# Patient Record
Sex: Female | Born: 1970
Health system: Southern US, Community
[De-identification: ages and names within clinical notes are randomized; demographics above are authoritative.]

## PROBLEM LIST (undated history)

## (undated) DIAGNOSIS — I10 Essential (primary) hypertension: Secondary | ICD-10-CM

## (undated) DIAGNOSIS — R87619 Unspecified abnormal cytological findings in specimens from cervix uteri: Secondary | ICD-10-CM

## (undated) DIAGNOSIS — N924 Excessive bleeding in the premenopausal period: Secondary | ICD-10-CM

## (undated) DIAGNOSIS — E0590 Thyrotoxicosis, unspecified without thyrotoxic crisis or storm: Secondary | ICD-10-CM

## (undated) HISTORY — PX: CERVICAL BIOPSY  W/ LOOP ELECTRODE EXCISION: SUR135

## (undated) HISTORY — PX: APPENDECTOMY: SHX54

## (undated) HISTORY — PX: TUBAL LIGATION: SHX77

## (undated) HISTORY — DX: Excessive bleeding in the premenopausal period: N92.4

## (undated) HISTORY — PX: DG CHOLECYSTOGRAPHY GALL BLADDER (ARMC HX): HXRAD1480

## (undated) HISTORY — DX: Unspecified abnormal cytological findings in specimens from cervix uteri: R87.619

## (undated) HISTORY — DX: Essential (primary) hypertension: I10

## (undated) HISTORY — DX: Thyrotoxicosis, unspecified without thyrotoxic crisis or storm: E05.90

---

## 2015-07-20 DIAGNOSIS — E0500 Thyrotoxicosis with diffuse goiter without thyrotoxic crisis or storm: Secondary | ICD-10-CM | POA: Insufficient documentation

## 2015-07-20 DIAGNOSIS — E0590 Thyrotoxicosis, unspecified without thyrotoxic crisis or storm: Secondary | ICD-10-CM | POA: Insufficient documentation

## 2015-10-23 DIAGNOSIS — G4733 Obstructive sleep apnea (adult) (pediatric): Secondary | ICD-10-CM | POA: Insufficient documentation

## 2015-10-23 DIAGNOSIS — E669 Obesity, unspecified: Secondary | ICD-10-CM | POA: Insufficient documentation

## 2015-10-23 DIAGNOSIS — I10 Essential (primary) hypertension: Secondary | ICD-10-CM | POA: Insufficient documentation

## 2015-10-23 DIAGNOSIS — Z87442 Personal history of urinary calculi: Secondary | ICD-10-CM | POA: Insufficient documentation

## 2015-10-23 DIAGNOSIS — R7303 Prediabetes: Secondary | ICD-10-CM | POA: Insufficient documentation

## 2015-11-03 ENCOUNTER — Ambulatory Visit (INDEPENDENT_AMBULATORY_CARE_PROVIDER_SITE_OTHER): Payer: 59 | Admitting: Sports Medicine

## 2015-11-03 VITALS — BP 118/79 | HR 50 | Resp 16 | Ht 62.5 in | Wt 189.5 lb

## 2015-11-03 DIAGNOSIS — N921 Excessive and frequent menstruation with irregular cycle: Secondary | ICD-10-CM

## 2015-11-03 DIAGNOSIS — E0590 Thyrotoxicosis, unspecified without thyrotoxic crisis or storm: Secondary | ICD-10-CM

## 2015-11-03 DIAGNOSIS — I10 Essential (primary) hypertension: Secondary | ICD-10-CM

## 2015-11-03 DIAGNOSIS — IMO0002 Reserved for concepts with insufficient information to code with codable children: Secondary | ICD-10-CM

## 2015-11-03 DIAGNOSIS — E668 Other obesity: Secondary | ICD-10-CM

## 2015-11-03 MED ORDER — NORGESTIMATE-ETH ESTRADIOL 0.25-35 MG-MCG PO TABS: 1.0000 | ORAL_TABLET | Freq: Every day | ORAL | Status: DC

## 2015-11-03 MED ORDER — LIRAGLUTIDE -WEIGHT MANAGEMENT 18 MG/3ML ~~LOC~~ SOPN: 3.0000 mg | PEN_INJECTOR | Freq: Every day | SUBCUTANEOUS | Status: DC

## 2015-11-03 NOTE — Assessment & Plan Note (Signed)
Irregular bleeding that is excessive and between periods, no postcoital bleeding and minimal pain. Starting hormonal contraception. Also getting a pelvic and transvaginal ultrasound.  Some of this may be due to her thyroid dysfunction.

## 2015-11-03 NOTE — Assessment & Plan Note (Signed)
Recent TFTs were normal. Next line she is off of all medication including methimazole. She will keep a close eye for return of hyperthyroid symptoms, and if they occur we will get TFTs sooner.

## 2015-11-03 NOTE — Progress Notes (Signed)
  Subjective:    CC: Establish care.   HPI:  Hyperthyroidism: Initially diagnosed as Graves' disease, has been on methimazole for some time however this put her into hypothyroidism, she was then on Synthroid for a while, she has been off of methimazole and Synthroid for some time now, and most recent TSH, T3, T4 with her endocrinologist were normal.  Menometrorrhagia: Excessive bleeding, in between cycles, no post intercourse bleeding, no vaginal discharge, no dysuria. Bleeding occurs every 2 weeks. Minimal pain. Has never had a pelvic and transvaginal ultrasound has never been on hormonal contraception. She does have bilateral tubal ligation.  Hypertension: Controlled.  Obesity: 50 pound weight loss on Saxenda several months ago, then insurance company decided to stop covering this, she has had great difficulty getting this appealed.  Past medical history, Surgical history, Family history not pertinant except as noted below, Social history, Allergies, and medications have been entered into the medical record, reviewed, and no changes needed.   Review of Systems: No headache, visual changes, nausea, vomiting, diarrhea, constipation, dizziness, abdominal pain, skin rash, fevers, chills, night sweats, swollen lymph nodes, weight loss, chest pain, body aches, joint swelling, muscle aches, shortness of breath, mood changes, visual or auditory hallucinations.  Objective:    General: Well Developed, well nourished, and in no acute distress.  Neuro: Alert and oriented x3, extra-ocular muscles intact, sensation grossly intact.  HEENT: Normocephalic, atraumatic, pupils equal round reactive to light, neck supple, no masses, no lymphadenopathy, thyroid nonpalpable.  Skin: Warm and dry, no rashes noted.  Cardiac: Regular rate and rhythm, no murmurs rubs or gallops.  Respiratory: Clear to auscultation bilaterally. Not using accessory muscles, speaking in full sentences.  Abdominal: Soft, nontender,  nondistended, positive bowel sounds, no masses, no organomegaly.  Musculoskeletal: Shoulder, elbow, wrist, hip, knee, ankle stable, and with full range of motion.  Impression and Recommendations:    The patient was counselled, risk factors were discussed, anticipatory guidance given.  I spent 45 minutes with this patient, greater than 50% was face-to-face time counseling regarding the above diagnoses

## 2015-11-03 NOTE — Assessment & Plan Note (Signed)
50 pound weight loss initially on Saxenda several months ago, needs an appeal letter written. I am going to resend the prescription.

## 2015-11-03 NOTE — Assessment & Plan Note (Signed)
Controlled, no changes. 

## 2015-11-04 ENCOUNTER — Other Ambulatory Visit: Payer: Self-pay | Admitting: Sports Medicine

## 2015-11-04 DIAGNOSIS — N921 Excessive and frequent menstruation with irregular cycle: Secondary | ICD-10-CM

## 2015-11-09 ENCOUNTER — Encounter: Payer: Self-pay | Admitting: Sports Medicine

## 2015-11-10 MED ORDER — LIRAGLUTIDE 18 MG/3ML ~~LOC~~ SOPN: PEN_INJECTOR | SUBCUTANEOUS | Status: DC

## 2015-11-13 ENCOUNTER — Other Ambulatory Visit: Payer: 59

## 2015-12-02 ENCOUNTER — Ambulatory Visit: Payer: 59 | Admitting: Sports Medicine

## 2015-12-16 ENCOUNTER — Ambulatory Visit (INDEPENDENT_AMBULATORY_CARE_PROVIDER_SITE_OTHER): Payer: 59 | Admitting: Sports Medicine

## 2015-12-16 ENCOUNTER — Encounter: Payer: Self-pay | Admitting: Sports Medicine

## 2015-12-16 DIAGNOSIS — E668 Other obesity: Secondary | ICD-10-CM

## 2015-12-16 DIAGNOSIS — E0590 Thyrotoxicosis, unspecified without thyrotoxic crisis or storm: Secondary | ICD-10-CM

## 2015-12-16 DIAGNOSIS — IMO0002 Reserved for concepts with insufficient information to code with codable children: Secondary | ICD-10-CM

## 2015-12-16 LAB — TSH: TSH: 0.03 mIU/L — ABNORMAL LOW

## 2015-12-16 LAB — T3, FREE: T3, Free: 3.4 pg/mL (ref 2.3–4.2)

## 2015-12-16 LAB — T4, FREE: Free T4: 1.5 ng/dL (ref 0.8–1.8)

## 2015-12-16 MED ORDER — PHENTERMINE HCL 37.5 MG PO TABS: ORAL_TABLET | ORAL | Status: DC

## 2015-12-16 MED ORDER — LIRAGLUTIDE 18 MG/3ML ~~LOC~~ SOPN: PEN_INJECTOR | SUBCUTANEOUS | Status: DC

## 2015-12-16 NOTE — Assessment & Plan Note (Signed)
Has been off methimazole now for some time, feels okay, rechecking T3, T4, TSH.

## 2015-12-16 NOTE — Assessment & Plan Note (Signed)
Refilling Victoza,adding phentermine, return monthly for weight checks and refills.

## 2015-12-16 NOTE — Progress Notes (Signed)
  Subjective:    CC: weight check  HPI: Obesity: Good initial response to Victoza, we were unable to get Saxenda approved, 5 pound weight loss. Would like to also start phentermine.  Hyperthyroidism: Has been off of methimazole for some time now, feels okay, has not yet had her TSH, T3, T4 rechecked  Past medical history, Surgical history, Family history not pertinant except as noted below, Social history, Allergies, and medications have been entered into the medical record, reviewed, and no changes needed.   Review of Systems: No fevers, chills, night sweats, weight loss, chest pain, or shortness of breath.   Objective:    General: Well Developed, well nourished, and in no acute distress.  Neuro: Alert and oriented x3, extra-ocular muscles intact, sensation grossly intact.  HEENT: Normocephalic, atraumatic, pupils equal round reactive to light, neck supple, no masses, no lymphadenopathy, thyroid nonpalpable.  Skin: Warm and dry, no rashes. Cardiac: Regular rate and rhythm, no murmurs rubs or gallops, no lower extremity edema.  Respiratory: Clear to auscultation bilaterally. Not using accessory muscles, speaking in full sentences.  Impression and Recommendations:    I spent 25 minutes with this patient, greater than 50% was face-to-face time counseling regarding the above diagnoses

## 2016-01-13 ENCOUNTER — Ambulatory Visit: Payer: 59 | Admitting: Sports Medicine

## 2016-01-18 ENCOUNTER — Encounter: Payer: Self-pay | Admitting: Sports Medicine

## 2016-01-18 ENCOUNTER — Ambulatory Visit (INDEPENDENT_AMBULATORY_CARE_PROVIDER_SITE_OTHER): Payer: 59

## 2016-01-18 ENCOUNTER — Ambulatory Visit (INDEPENDENT_AMBULATORY_CARE_PROVIDER_SITE_OTHER): Payer: 59 | Admitting: Sports Medicine

## 2016-01-18 VITALS — BP 117/80 | HR 73 | Resp 16 | Wt 187.6 lb

## 2016-01-18 DIAGNOSIS — M5412 Radiculopathy, cervical region: Secondary | ICD-10-CM | POA: Diagnosis not present

## 2016-01-18 DIAGNOSIS — M542 Cervicalgia: Secondary | ICD-10-CM | POA: Diagnosis not present

## 2016-01-18 DIAGNOSIS — E668 Other obesity: Secondary | ICD-10-CM | POA: Diagnosis not present

## 2016-01-18 DIAGNOSIS — IMO0002 Reserved for concepts with insufficient information to code with codable children: Secondary | ICD-10-CM

## 2016-01-18 MED ORDER — PREDNISONE 50 MG PO TABS: ORAL_TABLET | ORAL | Status: DC

## 2016-01-18 MED ORDER — MELOXICAM 15 MG PO TABS: ORAL_TABLET | ORAL | Status: DC

## 2016-01-18 MED ORDER — CYCLOBENZAPRINE HCL 10 MG PO TABS: ORAL_TABLET | ORAL | Status: DC

## 2016-01-18 NOTE — Assessment & Plan Note (Signed)
Physical therapy, prednisone, Flexeril, x-rays.  Return in one month, MRI if no better.

## 2016-01-18 NOTE — Progress Notes (Signed)
  Subjective:    CC: Upper back pain  HPI: This is a pleasant 45 year old female, for the past several weeks she's had pain that she localizes in her left upper back, periscapular, moderate, persistent without radiation. Occasional neck tightness and occasional radiation down the arm.  Past medical history, Surgical history, Family history not pertinant except as noted below, Social history, Allergies, and medications have been entered into the medical record, reviewed, and no changes needed.   Review of Systems: No fevers, chills, night sweats, weight loss, chest pain, or shortness of breath.   Objective:    General: Well Developed, well nourished, and in no acute distress.  Neuro: Alert and oriented x3, extra-ocular muscles intact, sensation grossly intact.  HEENT: Normocephalic, atraumatic, pupils equal round reactive to light, neck supple, no masses, no lymphadenopathy, thyroid nonpalpable.  Skin: Warm and dry, no rashes. Cardiac: Regular rate and rhythm, no murmurs rubs or gallops, no lower extremity edema.  Respiratory: Clear to auscultation bilaterally. Not using accessory muscles, speaking in full sentences. Neck: Negative spurling's Full neck range of motion Grip strength and sensation normal in bilateral hands Strength good C4 to T1 distribution No sensory change to C4 to T1 Reflexes normal  Impression and Recommendations:   I spent 25 minutes with this patient, greater than 50% was face-to-face time counseling regarding the above diagnoses

## 2016-01-18 NOTE — Assessment & Plan Note (Signed)
Continue Victoza, has not yet started phentermine.

## 2016-01-25 ENCOUNTER — Encounter: Payer: Self-pay | Admitting: Rehabilitative and Restorative Service Providers"

## 2016-01-25 ENCOUNTER — Ambulatory Visit (INDEPENDENT_AMBULATORY_CARE_PROVIDER_SITE_OTHER): Payer: 59 | Admitting: Rehabilitative and Restorative Service Providers"

## 2016-01-25 DIAGNOSIS — M5412 Radiculopathy, cervical region: Secondary | ICD-10-CM | POA: Diagnosis not present

## 2016-01-25 DIAGNOSIS — M542 Cervicalgia: Secondary | ICD-10-CM

## 2016-01-25 DIAGNOSIS — R293 Abnormal posture: Secondary | ICD-10-CM | POA: Diagnosis not present

## 2016-01-25 NOTE — Therapy (Addendum)
[facility] [place] [address] [place], [place], [address] Phone: [phone]   Fax:  [phone]  Physical Therapy Evaluation  Patient Details  Name: Elizabeth Mcgee MRN: [id number] Date of Birth: [birth year] Referring Provider: Dr. Benjamin Stain  Encounter Date: 01/25/2016      PT End of Session - 01/25/16 1410    Visit Number 1   Number of Visits 4   Date for PT Re-Evaluation 02/20/16   PT Start Time 1416   PT Stop Time 1515   PT Time Calculation (min) 59 min   Activity Tolerance Patient tolerated treatment well      History reviewed. No pertinent past medical history.  History reviewed. No pertinent past surgical history.  There were no vitals filed for this visit.       Subjective Assessment - 01/25/16 1420    Subjective Patient reports tht she had neck and Lt shoulder pain about 2 weeks ago - which was a pain between her shoulder blades. The pain was worse when she was lying down. She was seen by Dr. Benjamin Stain and started on prednisone with excellent results. She is symptoms free at this time.    Pertinent History Denies any previous episodes with radicular pain. Has some stiffness in her neck at times and it sometimes "cracks" when she moves neck; LBP and sciatica several years ago - no problems in 2 years. now in allegria shoes and lost ~50 pounds.    Diagnostic tests xrays WNL's    Currently in Pain? No/denies            [facility] PT Assessment - 01/25/16 0001    Assessment   Medical Diagnosis Lt cervical radiculopathy    Referring Provider Dr. Benjamin Stain   Onset Date/Surgical Date 01/11/16   Hand Dominance Right   Next MD Visit 7/17   Prior Therapy none   Precautions   Precautions None   Balance Screen   Has the patient fallen in the past 6 months No   Has the patient had a decrease in activity level because of a fear of falling?  No   Is the patient reluctant to leave their home because of a fear of  falling?  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   3 children and husband    Observation/Other Assessments   Focus on Therapeutic Outcomes (FOTO)  28% limitation    Sensation   Additional Comments WFL's    Posture/Postural Control   Posture Comments head forwad shoudlers rounded and elevated head of the humerus anterior in orientation scapulae abducted and rotated along the thoracic wall    AROM   Overall AROM Comments end range tightness    Cervical Flexion 52   Cervical Extension 42   Cervical - Right Side Bend 32   Cervical - Left Side Bend 33   Cervical - Right Rotation 66   Cervical - Left Rotation 65   Strength   Overall Strength Comments 5/5 bilat UE's    Palpation   Palpation comment muscular tightness through the ant/lat/post cervical musculature; upper traps; leveator; pecs  [facility] PT Treatment/Exercise - 01/25/16 0001    Self-Care   Self-Care --  suggestions for body mechanics/positions    Therapeutic Activites    Therapeutic Activities --  myofacial ball release work    Neuro Re-ed    Neuro Re-ed Details  working on posture and alignment engaging posterior shoulder girdle musculature    Shoulder Exercises: Standing   Extension Strengthening;Both;20 reps;Theraband   Theraband Level (Shoulder Extension) Level 2 (Red)   Row Strengthening;Both;20 reps;Theraband   Theraband Level (Shoulder Row) Level 2 (Red)   Retraction Strengthening;Both;20 reps;Theraband   Theraband Level (Shoulder Retraction) Level 1 (Yellow)  with swim noodle for tactile cue/position    Other Standing  Exercises scap squeeze 10 sec x 10 with swim noodle    Shoulder Exercises: Stretch   Other Shoulder Stretches 3 way doorway stretch 30 sec x 2 each position    Other Shoulder Stretches supine snow angle prolonged stretch arms at ~90 deg (to progress by adding noodle along spine    Neck Exercises: Stretches   Other Neck Stretches axial extension 10 sec x 10 reps                 PT Education - 01/25/16 1452    Education provided Yes   Education Details HEP    Person(s) Educated Patient   Methods Explanation;Demonstration;Tactile cues;Verbal cues;Handout   Comprehension Verbalized understanding;Returned demonstration;Verbal cues required;Tactile cues required          PT Short Term Goals - 01/25/16 1531    PT SHORT TERM GOAL #1   Title Patient to be instructed in initial HEP and postural correction 01/25/16   Time 1   Period Days   Status Achieved           PT Long Term Goals - 01/25/16 1528    PT LONG TERM GOAL #1   Title Improve posture and alignment with patient to demonstrate improve upright posture with scapulae down and back 02/20/16   Time 4   Period Weeks   Status New   PT LONG TERM GOAL #2   Title Patient to demo and report good body mechanics for work and leisure activities 02/20/16   Time 4   Period Weeks   Status New   PT LONG TERM GOAL #3   Title Indiependent in HEP 02/20/16   Time 4   Period Weeks   Status New   PT LONG TERM GOAL #4   Title Improve FOTO to </= 18% limitation 02/20/16   Time 4   Period Weeks   Status New               Plan - 01/25/16 1521    Clinical Impression Statement Elizabeth Mcgee presents with history of Lt upper quarter radicular pain which has resolved with prendisone. She has poor posture and alignment; limited cervical mobility and end range shoulder flex/ext; muscular tenderness and tightness through ant/lat/posterior cervical musculature, upper traps, leveator, pecs, teres bilat. 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 Patient would benefit form PT to address postureal issues and begin a stretching and strengthening program but is unsure she wants to proceed with regular PT at this time since she is symptoms free. She will contact us to schedule if she decides to come to PT for additional appointments.    Rehab Potential Good   PT Frequency 1x / week  PT Duration 4 weeks   PT Treatment/Interventions Patient/family education;Neuromuscular re-education;ADLs/Self Care Home Management;Cryotherapy;Electrical Stimulation;Iontophoresis 30m/ml Dexamethasone;Moist Heat;Ultrasound;Manual techniques;Dry needling;Therapeutic activities;Therapeutic exercise   PT Next Visit Plan progress with strengthing anterior chest; strengthening posterior shoulder girdle musculature; possible trial of TDN v manual work through cervical and upper trap areas; modalities as indicated   Consulted and Agree with Plan of Care Patient      Patient will benefit from skilled therapeutic intervention in order to improve the following deficits and impairments:  Postural dysfunction, Improper body mechanics, Increased fascial restricitons, Increased muscle spasms, Decreased range of motion, Decreased mobility  Visit Diagnosis: Radiculopathy, cervical region - Plan: PT plan of care cert/re-cert  Cervicalgia - Plan: PT plan of care cert/re-cert  Abnormal posture - Plan: PT plan of care cert/re-cert     Problem List Patient Active Problem List   Diagnosis Date Noted  . Left cervical radiculopathy 01/18/2016  . Menometrorrhagia 11/03/2015  . Essential (primary) hypertension 10/23/2015  . H/O renal calculi 10/23/2015  . Obstructive apnea 10/23/2015  . Adult BMI 30+ 10/23/2015  . Hyperthyroidism 07/20/2015    Elizabeth Mcgee PNilda SimmerPT, MPH  01/25/2016, 3:34 PM  [facility] [place] [address]Phone: [phone]  Fax:  [phone] Name: Elizabeth SlabachMRN: [id number] of Birth: 8May 27, 1972 PHYSICAL THERAPY DISCHARGE SUMMARY  Visits from Start of Care: Eval only  Current functional level related to goals / functional outcomes: See above    Remaining deficits: unchanged   Education / Equipment: Initial HEP  Plan: Patient agrees to discharge.  Patient goals were not met. Patient is being discharged due to not returning since the last visit.  ?????    Elizabeth Mcgee P. HHelene KelpPT, MPH 06/01/16 7:45 AM

## 2016-01-25 NOTE — Patient Instructions (Signed)
Axial Extension (Chin Tuck)    Pull chin in and lengthen back of neck. Hold _10___ seconds while counting out loud. Repeat __10__ times. Do _several___ sessions per day.   Shoulder Blade Squeeze    Rotate shoulders back, then squeeze shoulder blades down and back. Hold 10 sec Repeat __10__ times. Do __several __ sessions per day. Can use swim noodle along spine to provide tactile cue for posture and tightening middle and lower traps   Self massage using about a 4 inch rubber ball - along the back between shoulder blades and around the shoulder blade as well as through the pecs in front   Scapula Adduction With Pectoralis Stretch: Low - Standing   Shoulders at 45 hands even with shoulders, keeping weight through legs, shift weight forward until you feel pull or stretch through the front of your chest. Hold _30__ seconds. Do _3__ times, _2-4__ times per day.   Scapula Adduction With Pectoralis Stretch: Mid-Range - Standing   Shoulders at 90 elbows even with shoulders, keeping weight through legs, shift weight forward until you feel pull or strength through the front of your chest. Hold __30_ seconds. Do _3__ times, __2-4_ times per day.   Scapula Adduction With Pectoralis Stretch: High - Standing   Shoulders at 120 hands up high on the doorway, keeping weight on feet, shift weight forward until you feel pull or stretch through the front of your chest. Hold _30__ seconds. Do _3__ times, _2-3__ times per day.   Resisted External Rotation: in Neutral - Bilateral   PALMS UP Sit or stand, tubing in both hands, elbows at sides, bent to 90, forearms forward. Pinch shoulder blades together and rotate forearms out. Keep elbows at sides. Repeat __10__ times per set. Do _2-3___ sets per session. Do _2-3___ sessions per day.   Low Row: Standing   Face anchor, feet shoulder width apart. Palms up, pull arms back, squeezing shoulder blades together. Repeat 10__ times per set. Do  2-3__ sets per session. Do 2-3__ sessions per week. Anchor Height: Waist   Strengthening: Resisted Extension   Hold tubing in right hand, arm forward. Pull arm back, elbow straight. Repeat _10___ times per set. Do 2-3____ sets per session. Do 2-3____ sessions per day.   TENS UNIT: This is helpful for muscle pain and spasm.   Search and Purchase a TENS 7000 2nd edition at www.tenspros.com. It should be less than $30.     TENS unit instructions: Do not shower or bathe with the unit on Turn the unit off before removing electrodes or batteries If the electrodes lose stickiness add a drop of water to the electrodes after they are disconnected from the unit and place on plastic sheet. If you continued to have difficulty, call the TENS unit company to purchase more electrodes. Do not apply lotion on the skin area prior to use. Make sure the skin is clean and dry as this will help prolong the life of the electrodes. After use, always check skin for unusual red areas, rash or other skin difficulties. If there are any skin problems, does not apply electrodes to the same area. Never remove the electrodes from the unit by pulling the wires. Do not use the TENS unit or electrodes other than as directed. Do not change electrode placement without consultating your therapist or physician. Keep 2 fingers with between each electrode.

## 2016-02-15 ENCOUNTER — Ambulatory Visit: Payer: 59 | Admitting: Sports Medicine

## 2016-03-02 ENCOUNTER — Encounter: Payer: Self-pay | Admitting: Sports Medicine

## 2016-03-02 ENCOUNTER — Ambulatory Visit (INDEPENDENT_AMBULATORY_CARE_PROVIDER_SITE_OTHER): Payer: 59 | Admitting: Sports Medicine

## 2016-03-02 DIAGNOSIS — G43009 Migraine without aura, not intractable, without status migrainosus: Secondary | ICD-10-CM | POA: Diagnosis not present

## 2016-03-02 DIAGNOSIS — M5412 Radiculopathy, cervical region: Secondary | ICD-10-CM

## 2016-03-02 DIAGNOSIS — E668 Other obesity: Secondary | ICD-10-CM

## 2016-03-02 DIAGNOSIS — IMO0002 Reserved for concepts with insufficient information to code with codable children: Secondary | ICD-10-CM

## 2016-03-02 MED ORDER — PHENTERMINE HCL 37.5 MG PO TABS: ORAL_TABLET | ORAL | 0 refills | Status: DC

## 2016-03-02 MED ORDER — KETOROLAC TROMETHAMINE 30 MG/ML IJ SOLN
30.0000 mg | Freq: Once | INTRAMUSCULAR | Status: AC
Start: 2016-03-02 — End: 2016-03-02
  Administered 2016-03-02: 30 mg via INTRAMUSCULAR

## 2016-03-02 NOTE — Assessment & Plan Note (Signed)
Refilling phentermine, 7 pound weight loss after the first month on phentermine, continue Victoza. If she plateaus we will add Topamax, Contrave will probably come on later as a maintenance medication.

## 2016-03-02 NOTE — Progress Notes (Signed)
  Subjective:    CC: Follow-up  HPI: Obesity: Good weight loss, finally started phentermine.  Cervical degenerative disc disease: Resolved with prednisone and therapy. Headache: History of migraine, moderate, persistent with photophobia, phonophobia, no nausea. Left-sided temporal and throbbing.  Past medical history, Surgical history, Family history not pertinant except as noted below, Social history, Allergies, and medications have been entered into the medical record, reviewed, and no changes needed.   Review of Systems: No fevers, chills, night sweats, weight loss, chest pain, or shortness of breath.   Objective:    General: Well Developed, well nourished, and in no acute distress.  Neuro: Alert and oriented x3, extra-ocular muscles intact, sensation grossly intact. Cranial nerves II through XII are intact, motor, sensory, coordinative functions are all intact.  HEENT: Normocephalic, atraumatic, pupils equal round reactive to light, neck supple, no masses, no lymphadenopathy, thyroid nonpalpable.  Skin: Warm and dry, no rashes. Cardiac: Regular rate and rhythm, no murmurs rubs or gallops, no lower extremity edema.  Respiratory: Clear to auscultation bilaterally. Not using accessory muscles, speaking in full sentences.  Toradol 30 mg given intramuscular.  Impression and Recommendations:    I spent 25 minutes with this patient, greater than 50% was face-to-face time counseling regarding the above diagnoses

## 2016-03-02 NOTE — Assessment & Plan Note (Signed)
Toradol 30 intramuscular. Return as needed. Topamax would be a good option for weight loss considering her migraines.

## 2016-03-02 NOTE — Assessment & Plan Note (Signed)
Resolved with prednisone and rehabilitation exercises

## 2016-03-04 ENCOUNTER — Other Ambulatory Visit: Payer: Self-pay | Admitting: Sports Medicine

## 2016-03-30 ENCOUNTER — Encounter: Payer: Self-pay | Admitting: Sports Medicine

## 2016-03-30 ENCOUNTER — Ambulatory Visit (INDEPENDENT_AMBULATORY_CARE_PROVIDER_SITE_OTHER): Payer: 59 | Admitting: Sports Medicine

## 2016-03-30 DIAGNOSIS — IMO0002 Reserved for concepts with insufficient information to code with codable children: Secondary | ICD-10-CM

## 2016-03-30 DIAGNOSIS — E668 Other obesity: Secondary | ICD-10-CM | POA: Diagnosis not present

## 2016-03-30 MED ORDER — PHENTERMINE HCL 37.5 MG PO TABS: ORAL_TABLET | ORAL | 0 refills | Status: DC

## 2016-03-30 MED ORDER — LIRAGLUTIDE 18 MG/3ML ~~LOC~~ SOPN: PEN_INJECTOR | SUBCUTANEOUS | 11 refills | Status: DC

## 2016-03-30 NOTE — Progress Notes (Signed)
  Subjective:    CC: Weight check  HPI: Obesity: Additional 6 pound weight loss after another month of phentermine, we are entering the third month.  Hypertension: Controlled on recheck.  Past medical history, Surgical history, Family history not pertinant except as noted below, Social history, Allergies, and medications have been entered into the medical record, reviewed, and no changes needed.   Review of Systems: No fevers, chills, night sweats, weight loss, chest pain, or shortness of breath.   Objective:    General: Well Developed, well nourished, and in no acute distress.  Neuro: Alert and oriented x3, extra-ocular muscles intact, sensation grossly intact.  HEENT: Normocephalic, atraumatic, pupils equal round reactive to light, neck supple, no masses, no lymphadenopathy, thyroid nonpalpable.  Skin: Warm and dry, no rashes. Cardiac: Regular rate and rhythm, no murmurs rubs or gallops, no lower extremity edema.  Respiratory: Clear to auscultation bilaterally. Not using accessory muscles, speaking in full sentences.  Impression and Recommendations:    Adult BMI 30+ Additional 6 lb weight loss, we are entering the third month.

## 2016-03-30 NOTE — Assessment & Plan Note (Signed)
Additional 6 lb weight loss, we are entering the third month.

## 2016-04-27 ENCOUNTER — Ambulatory Visit: Payer: 59 | Admitting: Sports Medicine

## 2016-07-11 ENCOUNTER — Ambulatory Visit (INDEPENDENT_AMBULATORY_CARE_PROVIDER_SITE_OTHER): Payer: 59 | Admitting: Sports Medicine

## 2016-07-11 ENCOUNTER — Encounter: Payer: Self-pay | Admitting: Sports Medicine

## 2016-07-11 VITALS — BP 138/85 | HR 66 | Resp 16 | Wt 169.5 lb

## 2016-07-11 DIAGNOSIS — E0590 Thyrotoxicosis, unspecified without thyrotoxic crisis or storm: Secondary | ICD-10-CM

## 2016-07-11 DIAGNOSIS — Z87898 Personal history of other specified conditions: Secondary | ICD-10-CM

## 2016-07-11 DIAGNOSIS — E669 Obesity, unspecified: Secondary | ICD-10-CM

## 2016-07-11 DIAGNOSIS — M542 Cervicalgia: Secondary | ICD-10-CM | POA: Diagnosis not present

## 2016-07-11 DIAGNOSIS — E66811 Obesity, class 1: Secondary | ICD-10-CM

## 2016-07-11 DIAGNOSIS — K649 Unspecified hemorrhoids: Secondary | ICD-10-CM | POA: Diagnosis not present

## 2016-07-11 DIAGNOSIS — Z0000 Encounter for general adult medical examination without abnormal findings: Secondary | ICD-10-CM

## 2016-07-11 MED ORDER — HYDROCORTISONE ACETATE 25 MG RE SUPP: 25.0000 mg | Freq: Two times a day (BID) | RECTAL | 2 refills | Status: DC | PRN

## 2016-07-11 MED ORDER — PHENTERMINE HCL 37.5 MG PO TABS: ORAL_TABLET | ORAL | 0 refills | Status: DC

## 2016-07-11 MED ORDER — DOCUSATE SODIUM 100 MG PO CAPS: 100.0000 mg | ORAL_CAPSULE | Freq: Three times a day (TID) | ORAL | 3 refills | Status: DC | PRN

## 2016-07-11 MED ORDER — PREDNISONE 50 MG PO TABS: ORAL_TABLET | ORAL | 0 refills | Status: DC

## 2016-07-11 MED ORDER — WITCH HAZEL-GLYCERIN EX PADS: 1.0000 "application " | MEDICATED_PAD | CUTANEOUS | 11 refills | Status: DC | PRN

## 2016-07-11 NOTE — Progress Notes (Signed)
  Subjective:    CC: refill medications  HPI:   Obesity: has lost  5.6 pounds over the last 3 months. Last took Phentermine in October. Discontinued due to insurance/cost. Would like to re-start today.  HTN: controlled on recheck  Hemorrhoid: has had rectal pain x 3 weeks and states  hemorrhoid has "prolapsed." OTC medications (witch hazel, Preparation H, and topical hydrocortisone) are not helping. States her BM's are not regular due to her hyperthyroidism, but denies constipation or straining.  Neck pain:  Has been taking Aleve for the past 2 weeks. No known injury. Feels tightness throughout the back of her neck and limited ROM. 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 She would like Prednisone.   Past medical history:  See flowsheet/record as well for more information.  Surgical history: See flowsheet/record as well for more information.  Family history: See flowsheet/record as well for more information.  Social history: See flowsheet/record as well for more information.  Allergies, and medications have been entered into the medical record, reviewed, and no changes needed.   Review of Systems: No fevers, chills, night sweats, chest pain, or shortness of breath.   Objective:    General: Alert, obese, no distress Neuro: extra-ocular muscles intact, normal gait HEENT: Normocephalic, atraumatic Skin: No rashes on exposed skin. Respiratory: Not using accessory muscles, speaking in full sentences.   Impression and Recommendations:     Hemorrhoids Will start stool softeners and symptomatic management with hydrocortisone suppositories and tucks.   History of prediabetes Monitoring. Rechecking A1C. Continuing weight loss regimen with Phentermine and lifestyle changes  Obesity (BMI 30.0-34.9) Restarting Phentermine. Recheck weight in 3 months.  Neck pain, acute Prednisone 50mg  x 5 days. Continue Naproxen 500mg  every 12 hours. Symptomatic management  Discussed  preventive care due including labs and Pap smear. Patient will schedule an appointment for her wellness exam. Labs ordered.

## 2016-07-11 NOTE — Assessment & Plan Note (Signed)
Monitoring. Rechecking A1C. Continuing weight loss regimen with Phentermine and lifestyle changes

## 2016-07-11 NOTE — Assessment & Plan Note (Signed)
Will start stool softeners and symptomatic management with hydrocortisone suppositories and tucks.

## 2016-07-11 NOTE — Assessment & Plan Note (Signed)
Prednisone 50mg  x 5 days. Continue Naproxen 500mg  every 12 hours. Symptomatic management

## 2016-07-11 NOTE — Patient Instructions (Addendum)
Make an appointment for your annual physical and Pap.  About Hemorrhoids  Hemorrhoids are swollen veins in the lower rectum and anus.  Also called piles, hemorrhoids are a common problem.  Hemorrhoids may be internal (inside the rectum) or external (around the anus).  Internal Hemorrhoids  Internal hemorrhoids are often painless, but they rarely cause bleeding.  The internal veins may stretch and fall down (prolapse) through the anus to the outside of the body.  The veins may then become irritated and painful.  External Hemorrhoids  External hemorrhoids can be easily seen or felt around the anal opening.  They are under the skin around the anus.  When the swollen veins are scratched or broken by straining, rubbing or wiping they sometimes bleed.  How Hemorrhoids Occur  Veins in the rectum and around the anus tend to swell under pressure.  Hemorrhoids can result from increased pressure in the veins of your anus or rectum.  Some sources of pressure are:   Straining to have a bowel movement because of constipation  Waiting too long to have a bowel movement  Coughing and sneezing often  Sitting for extended periods of time, including on the toilet  Diarrhea  Obesity  Trauma or injury to the anus  Some liver diseases  Stress  Family history of hemorrhoids  Pregnancy  Pregnant women should try to avoid becoming constipated, because they are more likely to have hemorrhoids during pregnancy.  In the last trimester of pregnancy, the enlarged uterus may press on blood vessels and causes hemorrhoids.  In addition, the strain of childbirth sometimes causes hemorrhoids after the birth.  Symptoms of Hemorrhoids  Some symptoms of hemorrhoids include:  Swelling and/or a tender lump around the anus  Itching, mild burning and bleeding around the anus  Painful bowel movements with or without constipation  Bright red blood covering the stool, on toilet paper or in the toilet bowel.    Symptoms usually go away within a few days.  Always talk to your doctor about any bleeding to make sure it is not from some other causes.  Diagnosing and Treating Hemorrhoids  Diagnosis is made by an examination by your healthcare provider.  Special test can be performed by your doctor.    Most cases of hemorrhoids can be treated with:  High-fiber diet: Eat more high-fiber foods, which help prevent constipation.  Ask for more detailed fiber information on types and sources of fiber from your healthcare provider.  Fluids: Drink plenty of water.  This helps soften bowel movements so they are easier to pass.  Sitz baths and cold packs: Sitting in lukewarm water two or three times a day for 15 minutes cleases the anal area and may relieve discomfort.  If the water is too hot, swelling around the anus will get worse.  Placing a cloth-covered ice pack on the anus for ten minutes four times a day can also help reduce selling.  Gently pushing a prolapsed hemorrhoid back inside after the bath or ice pack can be helpful.  Medications: For mild discomfort, your healthcare provider may suggest over-the-counter pain medication or prescribe a cream or ointment for topical use.  The cream may contain witch hazel, zinc oxide or petroleum jelly.  Medicated suppositories are also a treatment option.  Always consult your doctor before applying medications or creams.  Procedures and surgeries: There are also a number of procedures and surgeries to shrink or remove hemorrhoids in more serious cases.  Talk to your physician about  these options.  You can often prevent hemorrhoids or keep them from becoming worse by maintaining a healthy lifestyle.  Eat a fiber-rich diet of fruits, vegetables and whole grains.  Also, drink plenty of water and exercise regularly.   2007, [employer] Doc.30

## 2016-07-11 NOTE — Assessment & Plan Note (Signed)
Restarting Phentermine. Recheck weight in 3 months.

## 2016-07-13 LAB — CBC WITH DIFFERENTIAL/PLATELET
BASOS PCT: 1 %
Basophils Absolute: 67 cells/uL (ref 0–200)
EOS PCT: 1 %
Eosinophils Absolute: 67 cells/uL (ref 15–500)
HCT: 40.7 % (ref 35.0–45.0)
Hemoglobin: 13.9 g/dL (ref 11.7–15.5)
LYMPHS PCT: 23 %
Lymphs Abs: 1541 cells/uL (ref 850–3900)
MCH: 30.2 pg (ref 27.0–33.0)
MCHC: 34.2 g/dL (ref 32.0–36.0)
MCV: 88.5 fL (ref 80.0–100.0)
MONO ABS: 335 {cells}/uL (ref 200–950)
MONOS PCT: 5 %
MPV: 10.4 fL (ref 7.5–12.5)
NEUTROS PCT: 70 %
Neutro Abs: 4690 cells/uL (ref 1500–7800)
PLATELETS: 371 10*3/uL (ref 140–400)
RBC: 4.6 MIL/uL (ref 3.80–5.10)
RDW: 13.5 % (ref 11.0–15.0)
WBC: 6.7 10*3/uL (ref 3.8–10.8)

## 2016-07-13 LAB — LIPID PANEL
Cholesterol: 135 mg/dL (ref <200)
HDL: 50 mg/dL — ABNORMAL LOW (ref >50)
LDL CALC: 79 mg/dL (ref <100)
TRIGLYCERIDES: 31 mg/dL (ref <150)
Total CHOL/HDL Ratio: 2.7 Ratio (ref <5.0)
VLDL: 6 mg/dL (ref <30)

## 2016-07-13 LAB — TSH: TSH: 0.39 mIU/L — ABNORMAL LOW

## 2016-07-13 LAB — BASIC METABOLIC PANEL
BUN: 12 mg/dL (ref 7–25)
CALCIUM: 9.2 mg/dL (ref 8.6–10.2)
CO2: 25 mmol/L (ref 20–31)
CREATININE: 0.71 mg/dL (ref 0.50–1.10)
Chloride: 103 mmol/L (ref 98–110)
GLUCOSE: 101 mg/dL — AB (ref 65–99)
POTASSIUM: 3.9 mmol/L (ref 3.5–5.3)
Sodium: 139 mmol/L (ref 135–146)

## 2016-07-14 LAB — HEMOGLOBIN A1C
Hgb A1c MFr Bld: 5.2 % (ref <5.7)
Mean Plasma Glucose: 103 mg/dL

## 2016-07-15 NOTE — Progress Notes (Signed)
She does not need to take the Levothyroxine at this time. We are going to monitor and recheck her TSH in 6 months.

## 2016-07-25 ENCOUNTER — Ambulatory Visit (INDEPENDENT_AMBULATORY_CARE_PROVIDER_SITE_OTHER): Payer: 59 | Admitting: Physician Assistant

## 2016-07-25 ENCOUNTER — Encounter: Payer: Self-pay | Admitting: Physician Assistant

## 2016-07-25 VITALS — BP 143/89 | HR 72 | Wt 169.0 lb

## 2016-07-25 DIAGNOSIS — N907 Vulvar cyst: Secondary | ICD-10-CM | POA: Diagnosis not present

## 2016-07-25 DIAGNOSIS — Z124 Encounter for screening for malignant neoplasm of cervix: Secondary | ICD-10-CM | POA: Diagnosis not present

## 2016-07-25 DIAGNOSIS — E0590 Thyrotoxicosis, unspecified without thyrotoxic crisis or storm: Secondary | ICD-10-CM | POA: Diagnosis not present

## 2016-07-25 NOTE — Progress Notes (Signed)
Elizabeth Mcgee is a 45 y.o. female who [facility]: [facility] today for Pap smear  Patient reports cysts on her vulva that she would like checked today. Cysts have been present for years.They are not painful and do not cause her any symptoms, but she is wondering if she should have them removed.  Patient has a history of menorrhagia, but states periods have been more mild and irregular recently. Periods typically last 4-5 days. LMP was first week in November.   She has a history of ASCUS (1993), but states her most recent Pap performed was normal.    Subclinical hyperthyroidism: patient has concerns about her thyroid that she wished to discuss today. She has previously been on Methimazole 10mg  TID, but this was [facility] March. She is worried that if she is not taking medication she will develop physical manifestations of Graves, including bulging eyes. She endorses some heat intolerance. Denies palpitations, weakness, unexplained weight gain. Lab Results  Component Value Date   TSH 0.39 (L) 07/11/2016  12/16/15 TSH: 0.03 Free T4: 1.5 Free T3: 3.4  10/23/15 TSH: 2.38 Free T4: 0.81 Free T3: 4.29 (while taking Methimazole)  Past Medical History:  Diagnosis Date  . Abnormal Pap smear of cervix   . Hypertension   . Hyperthyroidism   . Menorrhagia, premenopausal    Past Surgical History:  Procedure Laterality Date  . CERVICAL BIOPSY  W/ LOOP ELECTRODE EXCISION     Social History  Substance Use Topics  . Smoking status: Never Smoker  . Smokeless tobacco: Not on file  . Alcohol use Not on file   family history is not on file.  ROS: negative except as noted in the HPI Review of Systems  Constitutional: Positive for diaphoresis (occasional).  Cardiovascular: Negative for palpitations.  Gastrointestinal: Negative for abdominal pain.    Genitourinary: Negative for dysuria, frequency and urgency.       Denies dyspareunia, dysmenorrhea, and menorrhagia  Neurological: Negative for dizziness, tremors and focal weakness.    Medications: Current Outpatient Prescriptions  Medication Sig Dispense Refill  . docusate sodium (COLACE) 100 MG capsule Take 1 capsule (100 mg total) by mouth 3 (three) times daily as needed. 90 capsule 3  . hydrocortisone (ANUSOL-HC) 25 MG suppository Place 1 suppository (25 mg total) rectally 2 (two) times daily as needed for hemorrhoids or itching. 24 suppository 2  . Liraglutide (VICTOZA) 18 MG/3ML SOPN 1.8 mg injected subcutaneously daily.  Please include needles. 3 pen 11  . meloxicam (MOBIC) 15 MG tablet One tab PO qAM with breakfast for 2 weeks, then daily prn pain. 30 tablet 3  . methimazole (TAPAZOLE) 10 MG tablet Take 1 tablet by mouth daily.    . metoprolol tartrate (LOPRESSOR) 25 MG tablet Take 1 tablet by mouth 2 (two) times daily.    . phentermine (ADIPEX-P) 37.5 MG tablet One tab by mouth qAM 30 tablet 0  . predniSONE (DELTASONE) 50 MG tablet One tab PO daily for 5 days. 5 tablet 0  . witch hazel-glycerin (TUCKS) pad Apply 1 application topically as needed. 1 each 11   No current facility-administered medications for this visit.    No Known Allergies  Health Maintenance Health Maintenance  Topic Date Due  . HIV Screening  03/11/1986  . TETANUS/TDAP  03/11/1990  . INFLUENZA VACCINE  03/08/2016  . PAP SMEAR  08/08/2018     Objective:  BP (!) 143/89   Pulse 72   Wt  169 lb (76.7 kg)   BMI 30.42 kg/m  Physical Exam  Constitutional: She does not appear ill. No distress.  obese  HENT:  Head: Normocephalic and atraumatic.  Eyes: EOM and lids are normal.  No exopthalmos  Neck:  No goiter  Pulmonary/Chest: Effort normal.  Genitourinary: Vagina normal. Rectal exam shows external hemorrhoid.    Pelvic exam was performed with patient supine. There is no rash or tenderness on  the right labia. There is no rash or tenderness on the left labia. Cervix exhibits discharge (milky, whit). Cervix exhibits no friability.  Genitourinary Comments: Pap harvested from cervical os. Bimanual exam deferred.  Neurological: She is alert.  Skin: Skin is warm and dry. No rash noted.  Psychiatric: She has a normal mood and affect. Her speech is normal. Thought content normal.    I personally reviewed [facility] records in Care Everywhere, including Pap results and thyroid lab results.   Assessment and Plan: 45 y.o. female with   1. Encounter for Papanicolaou smear of cervix - At the end of the visit patient remembered that she had a Pap in January of this year. She requested to cancel the Pap  2. Epidermal cyst of vulva - Ambulatory referral to Gynecology  3. Subclinical hyperthyroidism - reviewed her previous thyroid panels together and explained what subclinical hyperthyroidism means. Provided reassurance.  - Plan to check thyroid labs every 3 months. Standing orders placed - TSH; Standing - T3, free; Standing - T4, free; Standing  Patient education and anticipatory guidance given Patient agrees with treatment plan Follow-up as needed  Levonne Hubertharley E. Halden Phegley PA-C

## 2016-07-25 NOTE — Patient Instructions (Signed)
Epidermal Cyst An epidermal cyst is sometimes called a sebaceous cyst, epidermal inclusion cyst, or infundibular cyst. These cysts usually contain a substance that looks "pasty" or "cheesy" and may have a bad smell. This substance is a protein called keratin. Epidermal cysts are usually found on the face, neck, or trunk. They may also occur in the vaginal area or other parts of the genitalia of both men and women. Epidermal cysts are usually small, painless, slow-growing bumps or lumps that move freely under the skin. It is important not to try to pop them. This may cause an infection and lead to tenderness and swelling. CAUSES  Epidermal cysts may be caused by a deep penetrating injury to the skin or a plugged hair follicle, often associated with acne. SYMPTOMS  Epidermal cysts can become inflamed and cause:  Redness.  Tenderness.  Increased temperature of the skin over the bumps or lumps.  Grayish-white, bad smelling material that drains from the bump or lump. DIAGNOSIS  Epidermal cysts are easily diagnosed by your caregiver during an exam. Rarely, a tissue sample (biopsy) may be taken to rule out other conditions that may resemble epidermal cysts. TREATMENT   Epidermal cysts often get better and disappear on their own. They are rarely ever cancerous.  If a cyst becomes infected, it may become inflamed and tender. This may require opening and draining the cyst. Treatment with antibiotics may be necessary. When the infection is gone, the cyst may be removed with minor surgery.  Small, inflamed cysts can often be treated with antibiotics or by injecting steroid medicines.  Sometimes, epidermal cysts become large and bothersome. If this happens, surgical removal in your caregiver's office may be necessary. HOME CARE INSTRUCTIONS  Only take over-the-counter or prescription medicines as directed by your caregiver.  Take your antibiotics as directed. Finish them even if you start to feel  better. SEEK MEDICAL CARE IF:   Your cyst becomes tender, red, or swollen.  Your condition is not improving or is getting worse.  You have any other questions or concerns. MAKE SURE YOU:  Understand these instructions.  Will watch your condition.  Will get help right away if you are not doing well or get worse. This information is not intended to replace advice given to you by your health care provider. Make sure you discuss any questions you have with your health care provider. Document Released: 06/25/2004 Document Revised: 10/17/2011 Document Reviewed: 05/27/2015 Elsevier Interactive Patient Education  2017 Elsevier Inc.  Pap Test Why am I having this test? A pap test is sometimes called a pap smear. It is a screening test that is used to check for signs of cancer of the vagina, cervix, and uterus. The test can also identify the presence of infection or precancerous changes. Your health care provider will likely recommend you have this test done on a regular basis. This test may be done:  Every 3 years, starting at age 45.  Every 5 years, in combination with testing for the presence of human papillomavirus (HPV).  More or less often depending on other medical conditions. What kind of sample is taken? Using a small cotton swab, plastic spatula, or brush, your health care provider will collect a sample of cells from the surface of your cervix. Your cervix is the opening to your uterus, also called a womb. Secretions from the cervix and vagina may also be collected. How do I prepare for this test?  Be aware of where you are in your menstrual cycle.  You may be asked to reschedule the test if you are menstruating on the day of the test.  You may need to reschedule if you have a known vaginal infection on the day of the test.  You may be asked to avoid douching or taking a bath the day before or the day of the test.  Some medicines can cause abnormal test results, such as  digitalis and tetracycline. Talk with your health care provider before your test if you take one of these medicines. What do the results mean? Abnormal test results may indicate a number of health conditions. These may include:  Cancer. Although pap test results cannot be used to diagnose cancer of the cervix, vagina, or uterus, they may suggest the possibility of cancer. Further tests would be required to determine if cancer is present.  Sexually transmitted disease.  Fungal infection.  Parasite infection.  Herpes infection.  A condition causing or contributing to infertility. It is your responsibility to obtain your test results. Ask the lab or department performing the test when and how you will get your results. Contact your health care provider to discuss any questions you have about your results. Talk with your health care provider to discuss your results, treatment options, and if necessary, the need for more tests. Talk with your health care provider if you have any questions about your results. This information is not intended to replace advice given to you by your health care provider. Make sure you discuss any questions you have with your health care provider. Document Released: 10/15/2002 Document Revised: 03/30/2016 Document Reviewed: 12/16/2013 Elsevier Interactive Patient Education  2017 [employer]

## 2016-10-17 ENCOUNTER — Telehealth: Payer: Self-pay | Admitting: Sports Medicine

## 2016-10-17 NOTE — Telephone Encounter (Signed)
Patient called upset about Jul 11, 2016 and Jul 25, 2016 [facility] and advising that her insurance stated [facility] is not in network and Dr. Karie Schwalbe is a specialist and they are not going to pay. I advised pt that she needs to call her insurance company and give Dr. Melvia Heaps's information and let them know that [facility] is listed under Dr. Karie Schwalbe emailed Haskell RilingWendy Blum about pt's complaint. Pt adv she was directed to call our office. Thanks

## 2016-10-18 ENCOUNTER — Ambulatory Visit (INDEPENDENT_AMBULATORY_CARE_PROVIDER_SITE_OTHER): Payer: 59 | Admitting: Osteopathic Medicine

## 2016-10-18 ENCOUNTER — Other Ambulatory Visit (HOSPITAL_COMMUNITY)
Admission: RE | Admit: 2016-10-18 | Discharge: 2016-10-18 | Disposition: A | Discharge status: Home / Self Care | Payer: 59 | Source: Ambulatory Visit | Attending: Osteopathic Medicine | Admitting: Osteopathic Medicine

## 2016-10-18 ENCOUNTER — Encounter: Payer: Self-pay | Admitting: Osteopathic Medicine

## 2016-10-18 VITALS — BP 130/86 | HR 82 | Ht 62.0 in | Wt 162.0 lb

## 2016-10-18 DIAGNOSIS — Z01419 Encounter for gynecological examination (general) (routine) without abnormal findings: Secondary | ICD-10-CM | POA: Diagnosis not present

## 2016-10-18 DIAGNOSIS — Z113 Encounter for screening for infections with a predominantly sexual mode of transmission: Secondary | ICD-10-CM

## 2016-10-18 DIAGNOSIS — E0590 Thyrotoxicosis, unspecified without thyrotoxic crisis or storm: Secondary | ICD-10-CM | POA: Diagnosis not present

## 2016-10-18 DIAGNOSIS — Z124 Encounter for screening for malignant neoplasm of cervix: Secondary | ICD-10-CM

## 2016-10-18 DIAGNOSIS — R7989 Other specified abnormal findings of blood chemistry: Secondary | ICD-10-CM

## 2016-10-18 DIAGNOSIS — R946 Abnormal results of thyroid function studies: Secondary | ICD-10-CM | POA: Diagnosis not present

## 2016-10-18 DIAGNOSIS — N907 Vulvar cyst: Secondary | ICD-10-CM | POA: Diagnosis not present

## 2016-10-18 DIAGNOSIS — Z1151 Encounter for screening for human papillomavirus (HPV): Secondary | ICD-10-CM | POA: Diagnosis not present

## 2016-10-18 LAB — WET PREP, GENITAL
Clue Cells Wet Prep HPF POC: NONE SEEN
TRICH WET PREP: NONE SEEN
Yeast Wet Prep HPF POC: NONE SEEN

## 2016-10-18 NOTE — Progress Notes (Signed)
HPI: Elizabeth Mcgee is a 46 y.o. female  who [facility] [place] today, 10/18/16,  for chief complaint of:  Chief Complaint  Patient presents with  . Annual Exam     Here for well-woman exam and STI check.   Hx Abn Pap, not sure date, no records avail.   Recently seen for female exam, Pap cancelled since pt had one in the same year. Referral to GYN for vaginal cysts.   Has some questions about cholesterol.   Would like refill on obesity medications   Thyroid tests recently ordered but not completed. Pt states was previously seeing endocrinologist, subclinical hyperthyroid on problem list    Past medical, surgical, social and family history reviewed: Patient Active Problem List   Diagnosis Date Noted  . Epidermal cyst of vulva 07/25/2016  . Neck pain, acute 07/11/2016  . Obesity (BMI 30.0-34.9) 07/11/2016  . Hemorrhoids 07/11/2016  . History of prediabetes 07/11/2016  . Migraine headache without aura 03/02/2016  . Left cervical radiculopathy 01/18/2016  . Menometrorrhagia 11/03/2015  . Essential (primary) hypertension 10/23/2015  . H/O renal calculi 10/23/2015  . Obstructive apnea 10/23/2015  . Adult BMI 30+ 10/23/2015  . Subclinical hyperthyroidism 07/20/2015   Past Surgical History:  Procedure Laterality Date  . CERVICAL BIOPSY  W/ LOOP ELECTRODE EXCISION     Social History  Substance Use Topics  . Smoking status: Never Smoker  . Smokeless tobacco: Never Used  . Alcohol use Not on file   No family history on file.   Current medication list and allergy/intolerance information reviewed:   Current Outpatient Prescriptions  Medication Sig Dispense Refill  . docusate sodium (COLACE) 100 MG capsule Take 1 capsule (100 mg total) by mouth 3 (three) times daily as needed. 90 capsule 3  . hydrocortisone (ANUSOL-HC) 25 MG suppository Place 1 suppository (25 mg total) rectally 2 (two) times daily as needed for hemorrhoids or itching.  24 suppository 2  . Liraglutide (VICTOZA) 18 MG/3ML SOPN 1.8 mg injected subcutaneously daily.  Please include needles. 3 pen 11  . meloxicam (MOBIC) 15 MG tablet One tab PO qAM with breakfast for 2 weeks, then daily prn pain. 30 tablet 3  . methimazole (TAPAZOLE) 10 MG tablet Take 1 tablet by mouth daily.    . metoprolol tartrate (LOPRESSOR) 25 MG tablet Take 1 tablet by mouth 2 (two) times daily.    . phentermine (ADIPEX-P) 37.5 MG tablet One tab by mouth qAM 30 tablet 0  . witch hazel-glycerin (TUCKS) pad Apply 1 application topically as needed. 1 each 11   No current facility-administered medications for this visit.    No Known Allergies    Review of Systems:  Constitutional:  No  fever, no chills, No recent illness  Cardiac: No  chest pain, No  pressure, No palpitations, No  Orthopnea  Respiratory:  No  shortness of breath. No  Cough   Genitourinary: No  abnormal genital bleeding, +abnormal genital discharge  Skin: No  Rash, No other wounds/concerning lesions  Psychiatric: No  concerns with depression, No  concerns with anxiety, No sleep problems, No mood problems  Exam:  BP 130/86   Pulse 82   Ht 5\' 2"  (1.575 m)   Wt 162 lb (73.5 kg)   BMI 29.63 kg/m   Constitutional: VS see above. General Appearance: alert, well-developed, well-nourished, NAD  Eyes: Normal lids and conjunctive, non-icteric sclera  Ears, Nose, Mouth, Throat: MMM, Normal external inspection ears/nares/mouth/lips/gums  Neck: No masses, trachea  midline. No thyroid enlargement.   Respiratory: Normal respiratory effort.   Gastrointestinal: Nontender, no masses.  Musculoskeletal: Gait normal. No clubbing/cyanosis of digits.   Neurological: Normal balance/coordination. No tremor.  Skin: warm, dry, intact. No rash/ulcer.   Psychiatric: Normal judgment/insight. Normal mood and affect. Oriented x3.  GYN: No rash/ulcers to external genitalia, normal urethra, normal vaginal mucosa, physiologic  discharge, cervix normal without lesions, uterus not enlarged or tender, adnexa no masses and nontender  BREAST: No rashes/skin changes, normal fibrous breast tissue, no masses or tenderness, normal nipple without discharge, normal axilla    ASSESSMENT/PLAN:   Cervical cancer screening - Plan: Cytology - PAP  Routine screening for STI (sexually transmitted infection) - Plan: Cytology - PAP, Hepatitis B core antibody, total, Hepatitis B surface antibody, Hepatitis B surface antigen, HIV antibody, RPR, Hepatitis C antibody, Wet prep, genital  Abnormal thyroid blood test - labs today, followup with PCP - Plan: Thyroid Panel With TSH  Subclinical hyperthyroidism  Epidermal cyst of vulva - Hasn't heard about GYN referral, recently changed phone numbers, advised visit their office after appt here and schedule consultation     Advised follow-up with PCP for routine care and other medical concerns. Did not refill phentermine today.      Visit summary with medication list and pertinent instructions was printed for patient to review. All questions at time of visit were answered - patient instructed to contact office with any additional concerns. ER/RTC precautions were reviewed with the patient. Follow-up plan: Return in about 2 weeks (around 11/01/2016) for visit with Dr. Karie Schwalbe re: thyroid and other routine medical care .

## 2016-10-19 LAB — THYROID PANEL WITH TSH
FREE THYROXINE INDEX: 2.1 (ref 1.4–3.8)
T3 UPTAKE: 30 % (ref 22–35)
T4 TOTAL: 6.9 ug/dL (ref 4.5–12.0)
TSH: 0.83 mIU/L

## 2016-10-19 LAB — HEPATITIS B CORE ANTIBODY, TOTAL: Hep B Core Total Ab: NONREACTIVE

## 2016-10-19 LAB — HEPATITIS C ANTIBODY: HCV AB: NEGATIVE

## 2016-10-19 LAB — CYTOLOGY - PAP
Chlamydia: NEGATIVE
Diagnosis: NEGATIVE
HPV: NOT DETECTED
Neisseria Gonorrhea: NEGATIVE
Trichomonas: NEGATIVE

## 2016-10-19 LAB — RPR

## 2016-10-19 LAB — HEPATITIS B SURFACE ANTIBODY,QUALITATIVE: Hep B S Ab: POSITIVE — AB

## 2016-10-19 LAB — HEPATITIS B SURFACE ANTIGEN: HEP B S AG: NEGATIVE

## 2016-10-19 LAB — HIV ANTIBODY (ROUTINE TESTING W REFLEX): HIV 1&2 Ab, 4th Generation: NONREACTIVE

## 2016-11-02 ENCOUNTER — Ambulatory Visit: Payer: 59 | Admitting: Sports Medicine

## 2016-11-07 ENCOUNTER — Ambulatory Visit (INDEPENDENT_AMBULATORY_CARE_PROVIDER_SITE_OTHER): Payer: 59 | Admitting: Sports Medicine

## 2016-11-07 ENCOUNTER — Encounter: Payer: Self-pay | Admitting: Sports Medicine

## 2016-11-07 DIAGNOSIS — G518 Other disorders of facial nerve: Secondary | ICD-10-CM

## 2016-11-07 DIAGNOSIS — E669 Obesity, unspecified: Secondary | ICD-10-CM

## 2016-11-07 DIAGNOSIS — L908 Other atrophic disorders of skin: Secondary | ICD-10-CM | POA: Insufficient documentation

## 2016-11-07 DIAGNOSIS — E66811 Obesity, class 1: Secondary | ICD-10-CM

## 2016-11-07 MED ORDER — PHENTERMINE HCL 37.5 MG PO TABS: ORAL_TABLET | ORAL | 0 refills | Status: DC

## 2016-11-07 NOTE — Progress Notes (Signed)
  Subjective:    CC: Weight check  HPI: This is a pleasant 46 year old female, she is here for a weight check, her last phentermine prescription was 4 months ago, she's lost 3 pounds, and has been out of phentermine for several months. She would like to restart.  She also some concerns about excess skin on her neck.  Past medical history:  Negative.  See flowsheet/record as well for more information.  Surgical history: Negative.  See flowsheet/record as well for more information.  Family history: Negative.  See flowsheet/record as well for more information.  Social history: Negative.  See flowsheet/record as well for more information.  Allergies, and medications have been entered into the medical record, reviewed, and no changes needed.   Review of Systems: No fevers, chills, night sweats, weight loss, chest pain, or shortness of breath.   Objective:    General: Well Developed, well nourished, and in no acute distress.  Neuro: Alert and oriented x3, extra-ocular muscles intact, sensation grossly intact.  HEENT: Normocephalic, atraumatic, pupils equal round reactive to light, neck supple, no masses, no lymphadenopathy, thyroid nonpalpable. Visible [place] neck. Skin: Warm and dry, no rashes. Cardiac: Regular rate and rhythm, no murmurs rubs or gallops, no lower extremity edema.  Respiratory: Clear to auscultation bilaterally. Not using accessory muscles, speaking in full sentences.  Impression and Recommendations:    Obesity (BMI 30.0-34.9) 3 pound weight loss over the past 3 months. Refilling phentermine but she will do a half dose every day for 3 months, return to see me in 3 months. She does have the added stress of her recent separation from her husband. She is also interested in platysmaplasty, I have asked her to look into this and if she desires she can call me and I will place the referral.  I spent 25 minutes with this patient, greater than 50% was face-to-face time  counseling regarding the above diagnoses

## 2016-11-07 NOTE — Assessment & Plan Note (Signed)
3 pound weight loss over the past 3 months. Refilling phentermine but she will do a half dose every day for 3 months, return to see me in 3 months. She does have the added stress of her recent separation from her husband. She is also interested in platysmaplasty, I have asked her to look into this and if she desires she can call me and I will place the referral.

## 2016-11-07 NOTE — Patient Instructions (Signed)
Look into platysmaplasty

## 2016-11-07 NOTE — Assessment & Plan Note (Signed)
With [place] neck. Recommended referral to plastic surgery for consideration of platysmaplasty.

## 2017-04-14 ENCOUNTER — Other Ambulatory Visit: Payer: Self-pay

## 2017-04-14 MED ORDER — LIRAGLUTIDE 18 MG/3ML ~~LOC~~ SOPN: PEN_INJECTOR | SUBCUTANEOUS | 11 refills | Status: DC

## 2017-07-14 ENCOUNTER — Ambulatory Visit (INDEPENDENT_AMBULATORY_CARE_PROVIDER_SITE_OTHER): Payer: 59 | Admitting: Sports Medicine

## 2017-07-14 ENCOUNTER — Encounter: Payer: Self-pay | Admitting: Sports Medicine

## 2017-07-14 DIAGNOSIS — E6609 Other obesity due to excess calories: Secondary | ICD-10-CM | POA: Diagnosis not present

## 2017-07-14 MED ORDER — PHENTERMINE HCL 37.5 MG PO TABS
ORAL_TABLET | ORAL | 0 refills | Status: DC
Start: 2017-07-14 — End: 2017-08-16

## 2017-07-14 MED ORDER — MUPIROCIN CALCIUM 2 % NA OINT: 1.0000 "application " | TOPICAL_OINTMENT | Freq: Two times a day (BID) | NASAL | 3 refills | Status: DC

## 2017-07-14 MED ORDER — LIRAGLUTIDE 18 MG/3ML ~~LOC~~ SOPN: PEN_INJECTOR | SUBCUTANEOUS | 11 refills | Status: DC

## 2017-07-14 NOTE — Assessment & Plan Note (Addendum)
Adding phentermine and Victoza. Return monthly for weight checks and refills. If insufficient weight loss at the follow-up visit we will add Topamax

## 2017-07-14 NOTE — Progress Notes (Signed)
  Subjective:    CC: Obesity  HPI: Elizabeth Mcgee returns, she struggled with her weight all of her life, we did phentermine and Victoza sometime ago, her insurance switched and she now has difficulty getting her Victoza.  She also like a refill on phentermine.  She is also complaining of a sore inside her right nasal nare, desires that we add nasal mupirocin.  Past medical history:  Negative.  See flowsheet/record as well for more information.  Surgical history: Negative.  See flowsheet/record as well for more information.  Family history: Negative.  See flowsheet/record as well for more information.  Social history: Negative.  See flowsheet/record as well for more information.  Allergies, and medications have been entered into the medical record, reviewed, and no changes needed.   (To billers/coders, pertinent past medical, social, surgical, family history can be found in problem list, if problem list is marked as reviewed then this indicates that past medical, social, surgical, family history was also reviewed)  Review of Systems: No fevers, chills, night sweats, weight loss, chest pain, or shortness of breath.   Objective:    General: Well Developed, well nourished, and in no acute distress.  Neuro: Alert and oriented x3, extra-ocular muscles intact, sensation grossly intact.  HEENT: Normocephalic, atraumatic, pupils equal round reactive to light, neck supple, no masses, no lymphadenopathy, thyroid nonpalpable.  Skin: Warm and dry, no rashes. Cardiac: Regular rate and rhythm, no murmurs rubs or gallops, no lower extremity edema.  Respiratory: Clear to auscultation bilaterally. Not using accessory muscles, speaking in full sentences.  Impression and Recommendations:    Obesity Adding phentermine and Victoza. Return monthly for weight checks and refills. If insufficient weight loss at the follow-up visit we will add Topamax  I spent 40 minutes with this patient, greater than 50% was  face-to-face time counseling regarding the above diagnoses ___________________________________________ Ihor Austinhomas J. Benjamin Stainhekkekandam, M.D., ABFM., CAQSM. Primary Care and Sports Medicine [place] [facility]  Adjunct Instructor of Family Medicine  [employer]

## 2017-08-16 ENCOUNTER — Ambulatory Visit: Payer: 59 | Admitting: Sports Medicine

## 2017-08-16 ENCOUNTER — Encounter: Payer: Self-pay | Admitting: Sports Medicine

## 2017-08-16 DIAGNOSIS — E0590 Thyrotoxicosis, unspecified without thyrotoxic crisis or storm: Secondary | ICD-10-CM | POA: Diagnosis not present

## 2017-08-16 DIAGNOSIS — E6609 Other obesity due to excess calories: Secondary | ICD-10-CM | POA: Diagnosis not present

## 2017-08-16 IMAGING — DX DG CERVICAL SPINE COMPLETE 4+V
6 series · 6 of 6 positions shown · non-contrast
Comparison: None.

CLINICAL DATA: Lower cervical pain that is midline for 1 week, no
recent injury, no radiation of symptoms.

EXAM:
CERVICAL SPINE - COMPLETE 4+ VIEW

[c-spine lat]
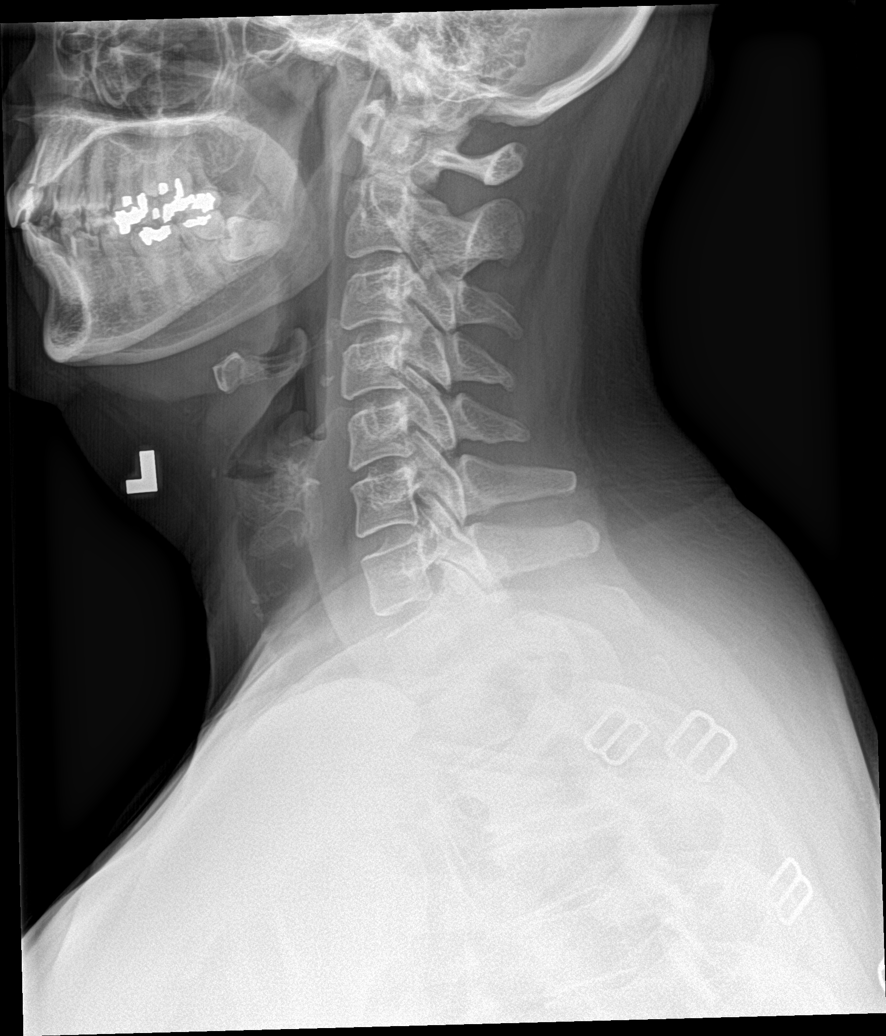

[c-spine obl (1 of 2)]
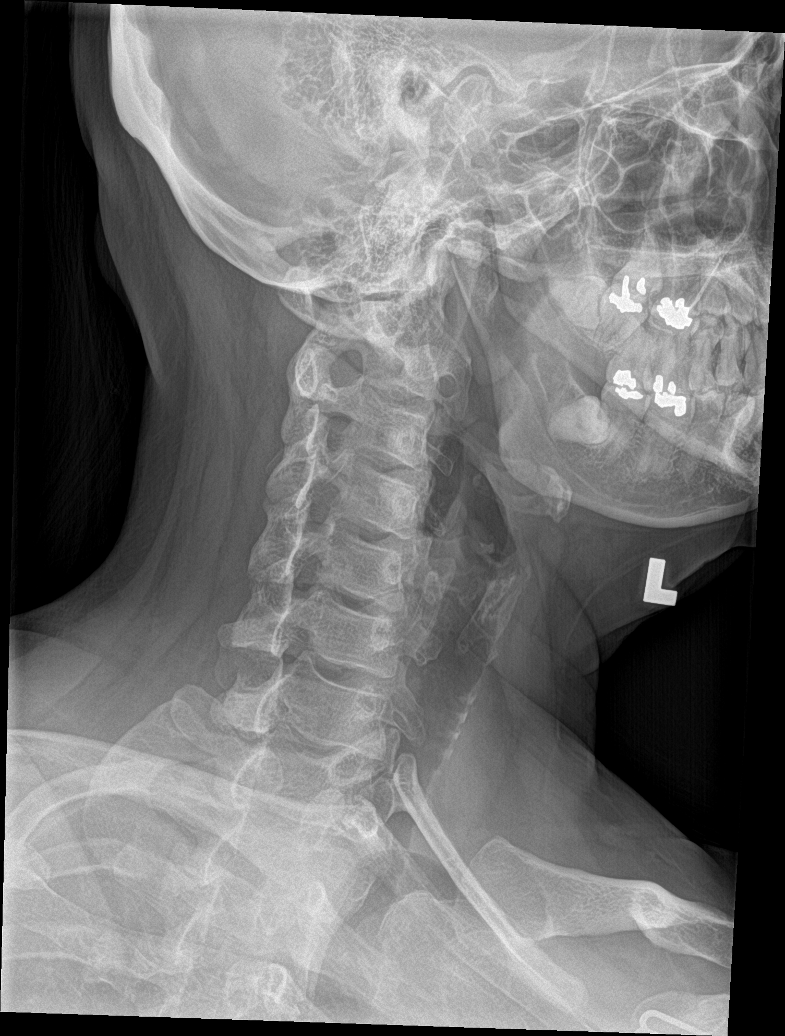

[c-spine obl (2 of 2)]
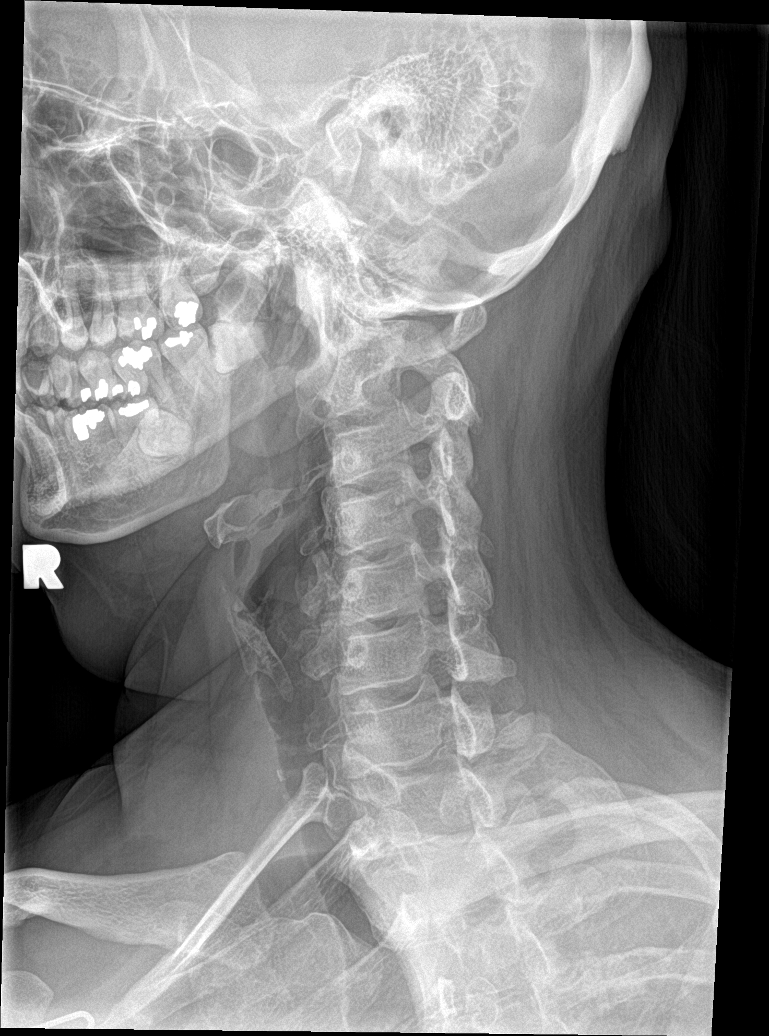

[c-spine ap]
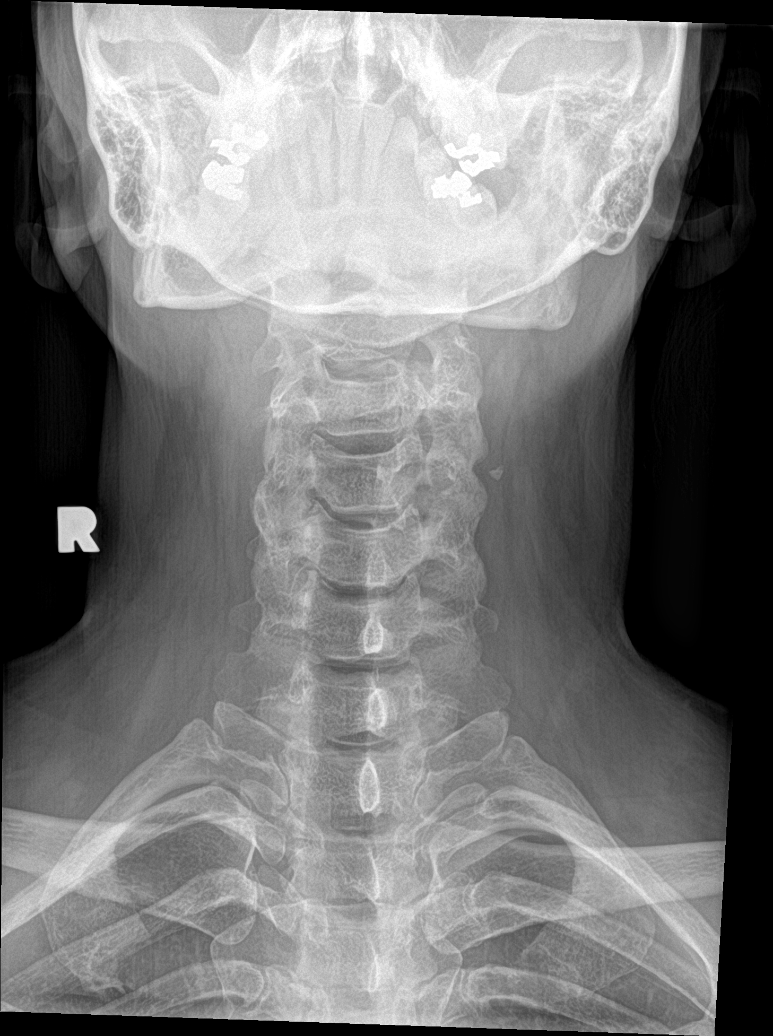

[c-spine open mouth]
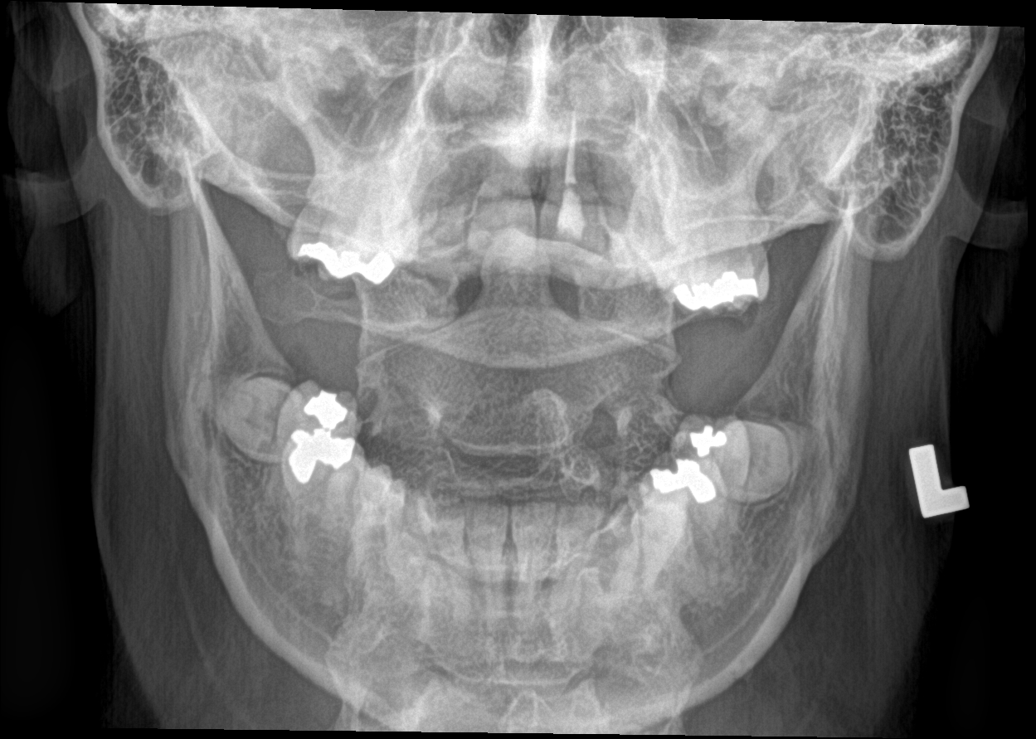

[c-spine swimmers]
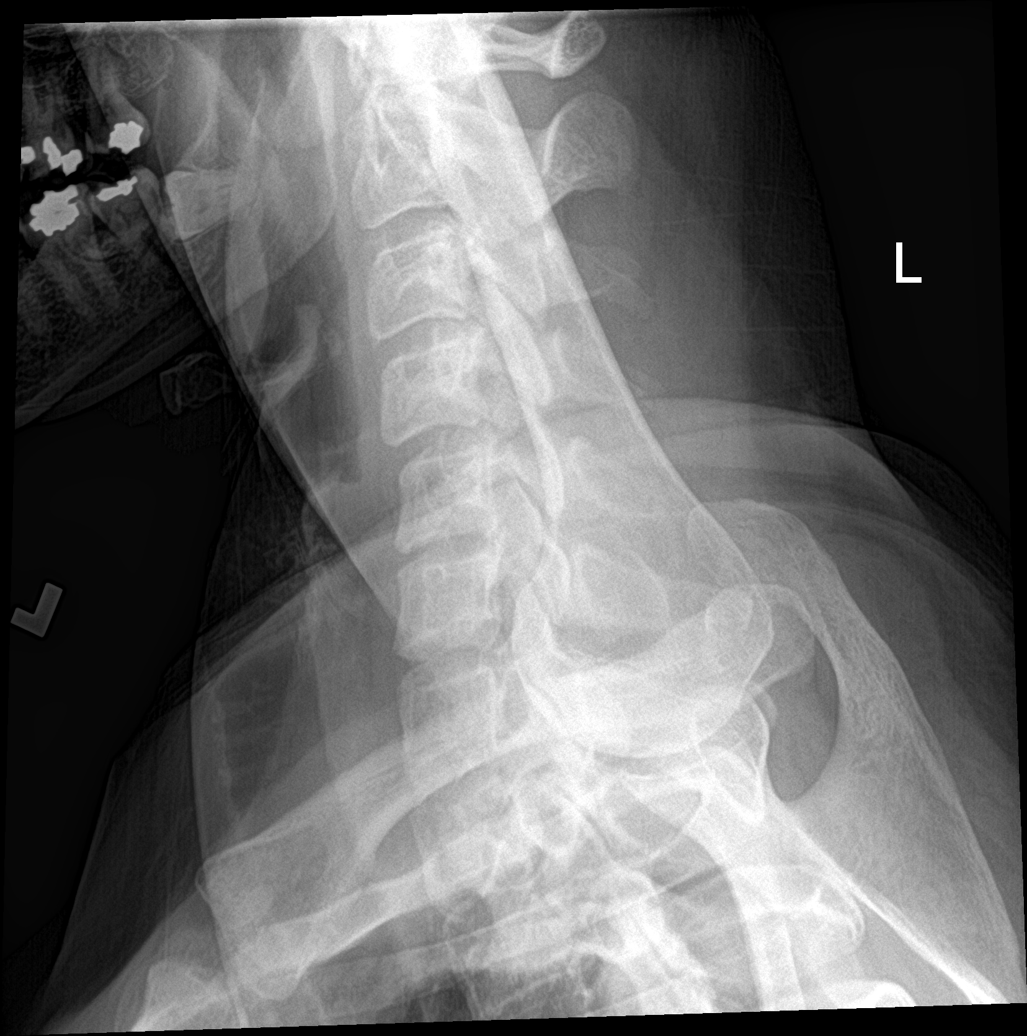

[6 of 6 positions shown; findings below may reference images not displayed]

FINDINGS: The cervical spine is visualized from the occiput to the
cervicothoracic junction. Alignment is anatomic. Vertebral body and
disc space height are maintained. Prevertebral soft tissues are
within normal limits. Neural foramina are patent.
IMPRESSION: No findings to explain the patient's pain.

## 2017-08-16 MED ORDER — PHENTERMINE HCL 37.5 MG PO TABS: ORAL_TABLET | ORAL | 0 refills | Status: DC

## 2017-08-16 NOTE — Progress Notes (Addendum)
  Subjective:    CC: Weight check  HPI: Elizabeth Mcgee returns, she is lost 7 pounds on phentermine.  I reviewed the past medical history, family history, social history, surgical history, and allergies today and no changes were needed.  Please see the problem list section below in epic for further details.  Past Medical History: Past Medical History:  Diagnosis Date  . Abnormal Pap smear of cervix   . Hypertension   . Hyperthyroidism   . Menorrhagia, premenopausal    Past Surgical History: Past Surgical History:  Procedure Laterality Date  . CERVICAL BIOPSY  W/ LOOP ELECTRODE EXCISION     Social History: Social History   Socioeconomic History  . Marital status: Married    Spouse name: None  . Number of children: None  . Years of education: None  . Highest education level: None  Social Needs  . Financial resource strain: None  . Food insecurity - worry: None  . Food insecurity - inability: None  . Transportation needs - medical: None  . Transportation needs - non-medical: None  Occupational History  . None  Tobacco Use  . Smoking status: Never Smoker  . Smokeless tobacco: Never Used  Substance and Sexual Activity  . Alcohol use: None  . Drug use: None  . Sexual activity: None  Other Topics Concern  . None  Social History Narrative  . None   Family History: No family history on file. Allergies: No Known Allergies Medications: See med rec.  Review of Systems: No fevers, chills, night sweats, weight loss, chest pain, or shortness of breath.   Objective:    General: Well Developed, well nourished, and in no acute distress.  Neuro: Alert and oriented x3, extra-ocular muscles intact, sensation grossly intact.  HEENT: Normocephalic, atraumatic, pupils equal round reactive to light, neck supple, no masses, no lymphadenopathy, thyroid nonpalpable.  Skin: Warm and dry, no rashes. Cardiac: Regular rate and rhythm, no murmurs rubs or gallops, no lower extremity edema.    Respiratory: Clear to auscultation bilaterally. Not using accessory muscles, speaking in full sentences.  Impression and Recommendations:    Obesity 7 pound weight loss after the first month on phentermine, unable to get Victoza covered. Return monthly for weight checks and refills.  Subclinical hyperthyroidism TSH is low, please add T3 and T4 levels to the blood already in the lab, orders placed. ___________________________________________ Ihor Austinhomas J. Benjamin Stainhekkekandam, M.D., ABFM., CAQSM. Primary Care and Sports Medicine [place] [facility]  Adjunct Instructor of Family Medicine  [employer]

## 2017-08-16 NOTE — Assessment & Plan Note (Signed)
7 pound weight loss after the first month on phentermine, unable to get Victoza covered. Return monthly for weight checks and refills.

## 2017-08-17 LAB — COMPREHENSIVE METABOLIC PANEL
AG Ratio: 1.5 (calc) (ref 1.0–2.5)
ALT: 17 U/L (ref 6–29)
AST: 15 U/L (ref 10–35)
Alkaline phosphatase (APISO): 52 U/L (ref 33–115)
BUN: 14 mg/dL (ref 7–25)
CO2: 27 mmol/L (ref 20–32)
Calcium: 9.2 mg/dL (ref 8.6–10.2)
Glucose, Bld: 91 mg/dL (ref 65–99)
Total Bilirubin: 1.2 mg/dL (ref 0.2–1.2)
Total Protein: 6.9 g/dL (ref 6.1–8.1)

## 2017-08-17 LAB — VITAMIN D 25 HYDROXY (VIT D DEFICIENCY, FRACTURES): Vit D, 25-Hydroxy: 31 ng/mL (ref 30–100)

## 2017-08-17 LAB — CBC
HCT: 40.4 % (ref 35.0–45.0)
Hemoglobin: 13.8 g/dL (ref 11.7–15.5)
MCH: 29.4 pg (ref 27.0–33.0)
MCHC: 34.2 g/dL (ref 32.0–36.0)
MCV: 86.1 fL (ref 80.0–100.0)
MPV: 11.5 fL (ref 7.5–12.5)
Platelets: 324 Thousand/uL (ref 140–400)
RBC: 4.69 Million/uL (ref 3.80–5.10)
RDW: 12.1 % (ref 11.0–15.0)
WBC: 8.3 10*3/uL (ref 3.8–10.8)

## 2017-08-17 LAB — COMPREHENSIVE METABOLIC PANEL WITH GFR
Albumin: 4.1 g/dL (ref 3.6–5.1)
Chloride: 102 mmol/L (ref 98–110)
Creat: 0.59 mg/dL (ref 0.50–1.10)
Globulin: 2.8 g/dL (ref 1.9–3.7)
Potassium: 3.7 mmol/L (ref 3.5–5.3)
Sodium: 137 mmol/L (ref 135–146)

## 2017-08-17 LAB — LIPID PANEL W/REFLEX DIRECT LDL
Cholesterol: 128 mg/dL (ref <200)
HDL: 48 mg/dL — ABNORMAL LOW (ref >50)
LDL Cholesterol (Calc): 62 mg/dL (calc)
Non-HDL Cholesterol (Calc): 80 mg/dL (ref <130)
Total CHOL/HDL Ratio: 2.7 (calc) (ref <5.0)
Triglycerides: 96 mg/dL (ref <150)

## 2017-08-17 LAB — HEMOGLOBIN A1C
Hgb A1c MFr Bld: 5 % of total Hgb (ref <5.7)
Mean Plasma Glucose: 97 (calc)
eAG (mmol/L): 5.4 (calc)

## 2017-08-17 LAB — T4, FREE: Free T4: 2 ng/dL — ABNORMAL HIGH (ref 0.8–1.8)

## 2017-08-17 LAB — T3, FREE: T3, Free: 4.6 pg/mL — ABNORMAL HIGH (ref 2.3–4.2)

## 2017-08-17 LAB — TSH: TSH: 0.01 mIU/L — ABNORMAL LOW

## 2017-08-17 NOTE — Assessment & Plan Note (Signed)
TSH is low, please add T3 and T4 levels to the blood already in the lab, orders placed.

## 2017-08-17 NOTE — Addendum Note (Signed)
Addended by: Monica BectonHEKKEKANDAM, THOMAS J on: 08/17/2017 09:37 AM   Modules accepted: Orders

## 2017-09-13 ENCOUNTER — Ambulatory Visit: Payer: 59 | Admitting: Sports Medicine

## 2017-12-18 ENCOUNTER — Ambulatory Visit: Payer: 59 | Admitting: Sports Medicine

## 2017-12-18 DIAGNOSIS — I10 Essential (primary) hypertension: Secondary | ICD-10-CM | POA: Diagnosis not present

## 2017-12-18 DIAGNOSIS — E0590 Thyrotoxicosis, unspecified without thyrotoxic crisis or storm: Secondary | ICD-10-CM | POA: Diagnosis not present

## 2017-12-18 DIAGNOSIS — E6609 Other obesity due to excess calories: Secondary | ICD-10-CM

## 2017-12-18 LAB — TSH: TSH: <0.01 mIU/L — ABNORMAL LOW

## 2017-12-18 LAB — T3, FREE: T3, Free: 13.6 pg/mL — ABNORMAL HIGH (ref 2.3–4.2)

## 2017-12-18 LAB — T4, FREE: Free T4: 5.2 ng/dL — ABNORMAL HIGH (ref 0.8–1.8)

## 2017-12-18 MED ORDER — TOPIRAMATE 50 MG PO TABS: ORAL_TABLET | ORAL | 0 refills | Status: DC

## 2017-12-18 MED ORDER — PHENTERMINE HCL 37.5 MG PO TABS: ORAL_TABLET | ORAL | 0 refills | Status: DC

## 2017-12-18 NOTE — Assessment & Plan Note (Addendum)
Rechecking TSH. Pitting edema can also be from uncontrolled thyroid disease.  TSH very low, probably need to get her back on methimazole.  Referral back to endocrinology.

## 2017-12-18 NOTE — Assessment & Plan Note (Signed)
With increasing lower extremity swelling. She has been eating a lot of bacon lately, has cut this back. She will get compression hose, elevate her feet. We can recheck this in a month.

## 2017-12-18 NOTE — Progress Notes (Addendum)
Subjective:    CC: Follow-up  HPI: Obesity: Desires to restart weight loss treatment.  Leg swelling: Present for the last 2 weeks, has been eating a lot of bacon before this.  No chest pain, shortness of breath, orthopnea, PND.  I reviewed the past medical history, family history, social history, surgical history, and allergies today and no changes were needed.  Please see the problem list section below in epic for further details.  Past Medical History: Past Medical History:  Diagnosis Date  . Abnormal Pap smear of cervix   . Hypertension   . Hyperthyroidism   . Menorrhagia, premenopausal    Past Surgical History: Past Surgical History:  Procedure Laterality Date  . CERVICAL BIOPSY  W/ LOOP ELECTRODE EXCISION     Social History: Social History   Socioeconomic History  . Marital status: Married    Spouse name: Not on file  . Number of children: Not on file  . Years of education: Not on file  . Highest education level: Not on file  Occupational History  . Not on file  Social Needs  . Financial resource strain: Not on file  . Food insecurity:    Worry: Not on file    Inability: Not on file  . Transportation needs:    Medical: Not on file    Non-medical: Not on file  Tobacco Use  . Smoking status: Never Smoker  . Smokeless tobacco: Never Used  Substance and Sexual Activity  . Alcohol use: Not on file  . Drug use: Not on file  . Sexual activity: Not on file  Lifestyle  . Physical activity:    Days per week: Not on file    Minutes per session: Not on file  . Stress: Not on file  Relationships  . Social connections:    Talks on phone: Not on file    Gets together: Not on file    Attends religious service: Not on file    Active member of club or organization: Not on file    Attends meetings of clubs or organizations: Not on file    Relationship status: Not on file  Other Topics Concern  . Not on file  Social History Narrative  . Not on file   Family  History: No family history on file. Allergies: No Known Allergies Medications: See med rec.  Review of Systems: No fevers, chills, night sweats, weight loss, chest pain, or shortness of breath.   Objective:    General: Well Developed, well nourished, and in no acute distress.  Neuro: Alert and oriented x3, extra-ocular muscles intact, sensation grossly intact.  HEENT: Normocephalic, atraumatic, pupils equal round reactive to light, neck supple, no masses, no lymphadenopathy, thyroid nonpalpable.  Skin: Warm and dry, no rashes. Cardiac: Regular rate and rhythm, no murmurs rubs or gallops, 2+ symmetric pitting lower extremity edema.  Respiratory: Clear to auscultation bilaterally. Not using accessory muscles, speaking in full sentences.  Impression and Recommendations:    Essential (primary) hypertension With increasing lower extremity swelling. She has been eating a lot of bacon lately, has cut this back. She will get compression hose, elevate her feet. We can recheck this in a month.  Subclinical hyperthyroidism Rechecking TSH. Pitting edema can also be from uncontrolled thyroid disease.  TSH very low, probably need to get her back on methimazole.  Referral back to endocrinology.  Obesity Per patient request restarting phentermine, Topamax. Her next visit will be with me because we need to check her legs, but  afterwards I am okay with her doing nurse visit.  ___________________________________________ Ihor Austin. Benjamin Stain, M.D., ABFM., CAQSM. Primary Care and Sports Medicine [place] [facility]  Adjunct Instructor of Family Medicine  [employer]

## 2017-12-18 NOTE — Assessment & Plan Note (Signed)
Per patient request restarting phentermine, Topamax. Her next visit will be with me because we need to check her legs, but afterwards I am okay with her doing nurse visit.

## 2017-12-21 NOTE — Addendum Note (Signed)
Addended by: Monica Becton on: 12/21/2017 09:59 AM   Modules accepted: Orders

## 2017-12-21 NOTE — Addendum Note (Signed)
Addended by: Monica Becton on: 12/21/2017 11:20 AM   Modules accepted: Orders

## 2018-01-15 ENCOUNTER — Ambulatory Visit: Payer: 59 | Admitting: Sports Medicine

## 2018-01-15 ENCOUNTER — Encounter: Payer: Self-pay | Admitting: Sports Medicine

## 2018-01-15 DIAGNOSIS — E6609 Other obesity due to excess calories: Secondary | ICD-10-CM

## 2018-01-15 DIAGNOSIS — E0590 Thyrotoxicosis, unspecified without thyrotoxic crisis or storm: Secondary | ICD-10-CM | POA: Diagnosis not present

## 2018-01-15 MED ORDER — TOPIRAMATE 50 MG PO TABS: ORAL_TABLET | ORAL | 0 refills | Status: DC

## 2018-01-15 MED ORDER — PHENTERMINE HCL 37.5 MG PO TABS: ORAL_TABLET | ORAL | 0 refills | Status: DC

## 2018-01-15 NOTE — Progress Notes (Signed)
Subjective:    CC: Weight check  HPI: Obesity: Only lost a few pounds, not fully compliant with her intermittent Topamax.  Lower extremity swelling: Still about the same, mostly dependent pitting edema and worse at the end of the day.  No PND, orthopnea.  Hyperthyroidism: Graves' disease, currently managed with endocrinology, on methimazole.  I reviewed the past medical history, family history, social history, surgical history, and allergies today and no changes were needed.  Please see the problem list section below in epic for further details.  Past Medical History: Past Medical History:  Diagnosis Date  . Abnormal Pap smear of cervix   . Hypertension   . Hyperthyroidism   . Menorrhagia, premenopausal    Past Surgical History: Past Surgical History:  Procedure Laterality Date  . CERVICAL BIOPSY  W/ LOOP ELECTRODE EXCISION     Social History: Social History   Socioeconomic History  . Marital status: Married    Spouse name: Not on file  . Number of children: Not on file  . Years of education: Not on file  . Highest education level: Not on file  Occupational History  . Not on file  Social Needs  . Financial resource strain: Not on file  . Food insecurity:    Worry: Not on file    Inability: Not on file  . Transportation needs:    Medical: Not on file    Non-medical: Not on file  Tobacco Use  . Smoking status: Never Smoker  . Smokeless tobacco: Never Used  Substance and Sexual Activity  . Alcohol use: Not on file  . Drug use: Not on file  . Sexual activity: Not on file  Lifestyle  . Physical activity:    Days per week: Not on file    Minutes per session: Not on file  . Stress: Not on file  Relationships  . Social connections:    Talks on phone: Not on file    Gets together: Not on file    Attends religious service: Not on file    Active member of club or organization: Not on file    Attends meetings of clubs or organizations: Not on file    Relationship  status: Not on file  Other Topics Concern  . Not on file  Social History Narrative  . Not on file   Family History: No family history on file. Allergies: No Known Allergies Medications: See med rec.  Review of Systems: No fevers, chills, night sweats, weight loss, chest pain, or shortness of breath.   Objective:    General: Well Developed, well nourished, and in no acute distress.  Neuro: Alert and oriented x3, extra-ocular muscles intact, sensation grossly intact.  HEENT: Normocephalic, atraumatic, pupils equal round reactive to light, neck supple, no masses, no lymphadenopathy, thyroid nonpalpable.  Skin: Warm and dry, no rashes. Cardiac: Regular rate and rhythm, no murmurs rubs or gallops, 1+ bilateral symmetric lower extremity edema. Respiratory: Clear to auscultation bilaterally. Not using accessory muscles, speaking in full sentences.  Impression and Recommendations:    Subclinical hyperthyroidism Currently on methimazole treatment for Graves' disease with endocrinology.  Obesity Only lost a few pounds, has not been fully compliant with phentermine and Topamax. Refilling medications, entering the second month, nurse visits are okay from here on out. She is having some dependent edema in both legs at the end of the day, this will likely improve with her weight loss. She has not yet gotten her compression hose.  I spent 25 minutes  with this patient, greater than 50% was face-to-face time counseling regarding the above diagnoses ___________________________________________ Ihor Austinhomas J. Benjamin Stainhekkekandam, M.D., ABFM., CAQSM. Primary Care and Sports Medicine [place] [facility]  Adjunct Instructor of Family Medicine  [employer]

## 2018-01-15 NOTE — Assessment & Plan Note (Signed)
Only lost a few pounds, has not been fully compliant with phentermine and Topamax. Refilling medications, entering the second month, nurse visits are okay from here on out. She is having some dependent edema in both legs at the end of the day, this will likely improve with her weight loss. She has not yet gotten her compression hose.

## 2018-01-15 NOTE — Assessment & Plan Note (Signed)
Currently on methimazole treatment for Graves' disease with endocrinology.

## 2018-02-14 ENCOUNTER — Ambulatory Visit (INDEPENDENT_AMBULATORY_CARE_PROVIDER_SITE_OTHER): Payer: 59 | Admitting: Sports Medicine

## 2018-02-14 VITALS — BP 138/57 | HR 70 | Wt 165.0 lb

## 2018-02-14 DIAGNOSIS — R635 Abnormal weight gain: Secondary | ICD-10-CM | POA: Diagnosis not present

## 2018-02-14 DIAGNOSIS — E6609 Other obesity due to excess calories: Secondary | ICD-10-CM | POA: Diagnosis not present

## 2018-02-14 MED ORDER — PHENTERMINE HCL 37.5 MG PO TABS: ORAL_TABLET | ORAL | 0 refills | Status: DC

## 2018-02-14 NOTE — Progress Notes (Signed)
   Subjective:    Patient ID: Elizabeth Mcgee, female    DOB: 03/14/1971, 47 y.o.   MRN: [id number]  HPI  Elizabeth Mcgee is here for blood pressure and weight check. Diet and exercise is going well. Denies trouble sleeping or palpitations.   Review of Systems     Objective:   Physical Exam        Assessment & Plan:  Abnormal weight gain - Patient has lost weight. A refill on phentermine sent to pharmacy. Patient advised to schedule a follow up in 4 weeks.

## 2018-03-12 DIAGNOSIS — E0590 Thyrotoxicosis, unspecified without thyrotoxic crisis or storm: Secondary | ICD-10-CM | POA: Diagnosis not present

## 2018-03-16 ENCOUNTER — Ambulatory Visit: Payer: 59

## 2018-03-23 ENCOUNTER — Ambulatory Visit: Payer: Self-pay

## 2018-04-16 ENCOUNTER — Ambulatory Visit (INDEPENDENT_AMBULATORY_CARE_PROVIDER_SITE_OTHER): Payer: BLUE CROSS/BLUE SHIELD | Admitting: Sports Medicine

## 2018-04-16 DIAGNOSIS — E6609 Other obesity due to excess calories: Secondary | ICD-10-CM | POA: Diagnosis not present

## 2018-04-16 MED ORDER — TOPIRAMATE 50 MG PO TABS: ORAL_TABLET | ORAL | 0 refills | Status: DC

## 2018-04-16 NOTE — Assessment & Plan Note (Signed)
Noncompliant with treatment plan. She is gained a good amount of weight. We allowed her to do nurse visits for weight checks but she still has missed a month, discontinue phentermine, we will do Topamax only from here on out. Return in 1 month for a nurse visit weight check.

## 2018-04-16 NOTE — Progress Notes (Signed)
   Subjective:    Patient ID: Elizabeth Mcgee, female    DOB: [birth year], 47 y.o.   MRN: [id number]  HPI  Elizabeth Mcgee is here for blood pressure and weight check. Diet and exercise is going well. Denies trouble sleeping or palpitations. She has been out of medications for a few weeks.   Review of Systems     Objective:   Physical Exam        Assessment & Plan:  Abnormal weight gain - She has not lost weight.

## 2018-04-17 NOTE — Progress Notes (Signed)
Patient advised of recommendations.  

## 2018-05-16 ENCOUNTER — Ambulatory Visit: Payer: BLUE CROSS/BLUE SHIELD | Admitting: Sports Medicine

## 2018-05-16 ENCOUNTER — Encounter: Payer: Self-pay | Admitting: Sports Medicine

## 2018-05-16 VITALS — BP 123/77 | HR 59 | Ht 62.0 in | Wt 173.0 lb

## 2018-05-16 DIAGNOSIS — Z23 Encounter for immunization: Secondary | ICD-10-CM

## 2018-05-16 DIAGNOSIS — E6609 Other obesity due to excess calories: Secondary | ICD-10-CM | POA: Diagnosis not present

## 2018-05-16 MED ORDER — PHENTERMINE HCL 37.5 MG PO TABS: ORAL_TABLET | ORAL | 0 refills | Status: DC

## 2018-05-16 NOTE — Progress Notes (Signed)
  Subjective:    CC: Weight check  HPI: Elizabeth Mcgee returns, she is about the same weight, has not been doing Topamax, we cut her off from phentermine due to unreliable follow-up.  Preventive measures: Due for Tdap.  I reviewed the past medical history, family history, social history, surgical history, and allergies today and no changes were needed.  Please see the problem list section below in epic for further details.  Past Medical History: Past Medical History:  Diagnosis Date  . Abnormal Pap smear of cervix   . Hypertension   . Hyperthyroidism   . Menorrhagia, premenopausal    Past Surgical History: Past Surgical History:  Procedure Laterality Date  . CERVICAL BIOPSY  W/ LOOP ELECTRODE EXCISION     Social History: Social History   Socioeconomic History  . Marital status: Married    Spouse name: Not on file  . Number of children: Not on file  . Years of education: Not on file  . Highest education level: Not on file  Occupational History  . Not on file  Social Needs  . Financial resource strain: Not on file  . Food insecurity:    Worry: Not on file    Inability: Not on file  . Transportation needs:    Medical: Not on file    Non-medical: Not on file  Tobacco Use  . Smoking status: Never Smoker  . Smokeless tobacco: Never Used  Substance and Sexual Activity  . Alcohol use: Not on file  . Drug use: Not on file  . Sexual activity: Not on file  Lifestyle  . Physical activity:    Days per week: Not on file    Minutes per session: Not on file  . Stress: Not on file  Relationships  . Social connections:    Talks on phone: Not on file    Gets together: Not on file    Attends religious service: Not on file    Active member of club or organization: Not on file    Attends meetings of clubs or organizations: Not on file    Relationship status: Not on file  Other Topics Concern  . Not on file  Social History Narrative  . Not on file   Family History: No family  history on file. Allergies: No Known Allergies Medications: See med rec.  Review of Systems: No fevers, chills, night sweats, weight loss, chest pain, or shortness of breath.   Objective:    General: Well Developed, well nourished, and in no acute distress.  Neuro: Alert and oriented x3, extra-ocular muscles intact, sensation grossly intact.  HEENT: Normocephalic, atraumatic, pupils equal round reactive to light, neck supple, no masses, no lymphadenopathy, thyroid nonpalpable.  Skin: Warm and dry, no rashes. Cardiac: Regular rate and rhythm, no murmurs rubs or gallops, no lower extremity edema.  Respiratory: Clear to auscultation bilaterally. Not using accessory muscles, speaking in full sentences.  Impression and Recommendations:    Obesity Giving her one more chance on phentermine. Return monthly for weight checks and refills. If plateaus, gains weight, or does not follow-up we will do Topamax in an up taper, I did just have to explain to her that it does work on its own when given the appropriate duration of an up taper. ___________________________________________ Ihor Austin. Benjamin Stain, M.D., ABFM., CAQSM. Primary Care and Sports Medicine [place] [facility]  Adjunct Instructor of Family Medicine  [employer]

## 2018-05-16 NOTE — Assessment & Plan Note (Signed)
Giving her one more chance on phentermine. Return monthly for weight checks and refills. If plateaus, gains weight, or does not follow-up we will do Topamax in an up taper, I did just have to explain to her that it does work on its own when given the appropriate duration of an up taper.

## 2018-06-20 ENCOUNTER — Ambulatory Visit (INDEPENDENT_AMBULATORY_CARE_PROVIDER_SITE_OTHER): Payer: BLUE CROSS/BLUE SHIELD | Admitting: Sports Medicine

## 2018-06-20 DIAGNOSIS — E6609 Other obesity due to excess calories: Secondary | ICD-10-CM | POA: Diagnosis not present

## 2018-06-20 MED ORDER — TOPIRAMATE 50 MG PO TABS: ORAL_TABLET | ORAL | 0 refills | Status: DC

## 2018-06-20 MED ORDER — PHENTERMINE HCL 37.5 MG PO TABS: ORAL_TABLET | ORAL | 0 refills | Status: DC

## 2018-06-20 NOTE — Assessment & Plan Note (Signed)
Adding Topamax in an up taper to twice a day, refilling phentermine. Nurse visit follow-up in 1 month

## 2018-06-20 NOTE — Progress Notes (Signed)
Pt in today for BP and weight check.Weight at last visit was 173 lbs, todays weight was 178 lbs. Bp was 137/69 pulse 64. Pt states she has been taking phentermine without complaints of any side effects. Pt states she is continuously walking at work and lifts some weights at home.  Advised patient I would send note to provider to see next step is needed.  Pharmacy verified.

## 2018-06-25 NOTE — Progress Notes (Signed)
Spoke with patient , she was aware RX was called in .

## 2018-07-13 ENCOUNTER — Other Ambulatory Visit: Payer: Self-pay | Admitting: *Deleted

## 2018-07-13 MED ORDER — METOPROLOL TARTRATE 25 MG PO TABS: 25.0000 mg | ORAL_TABLET | Freq: Two times a day (BID) | ORAL | 3 refills | Status: DC

## 2019-03-25 DIAGNOSIS — Z03818 Encounter for observation for suspected exposure to other biological agents ruled out: Secondary | ICD-10-CM | POA: Diagnosis not present

## 2019-03-25 DIAGNOSIS — U071 COVID-19: Secondary | ICD-10-CM | POA: Diagnosis not present

## 2019-03-25 DIAGNOSIS — Z20828 Contact with and (suspected) exposure to other viral communicable diseases: Secondary | ICD-10-CM | POA: Diagnosis not present

## 2019-04-05 ENCOUNTER — Ambulatory Visit (INDEPENDENT_AMBULATORY_CARE_PROVIDER_SITE_OTHER): Payer: BLUE CROSS/BLUE SHIELD | Admitting: Sports Medicine

## 2019-04-05 ENCOUNTER — Other Ambulatory Visit: Payer: Self-pay

## 2019-04-05 ENCOUNTER — Encounter: Payer: Self-pay | Admitting: Sports Medicine

## 2019-04-05 DIAGNOSIS — E6609 Other obesity due to excess calories: Secondary | ICD-10-CM | POA: Diagnosis not present

## 2019-04-05 MED ORDER — PHENTERMINE HCL 37.5 MG PO TABS: ORAL_TABLET | ORAL | 0 refills | Status: DC

## 2019-04-05 NOTE — Progress Notes (Signed)
  Subjective:    CC: Follow-up  HPI: Elizabeth Mcgee returns, she would like to get back on phentermine.  I reviewed the past medical history, family history, social history, surgical history, and allergies today and no changes were needed.  Please see the problem list section below in epic for further details.  Past Medical History: Past Medical History:  Diagnosis Date  . Abnormal Pap smear of cervix   . Hypertension   . Hyperthyroidism   . Menorrhagia, premenopausal    Past Surgical History: Past Surgical History:  Procedure Laterality Date  . CERVICAL BIOPSY  W/ LOOP ELECTRODE EXCISION     Social History: Social History   Socioeconomic History  . Marital status: Married    Spouse name: Not on file  . Number of children: Not on file  . Years of education: Not on file  . Highest education level: Not on file  Occupational History  . Not on file  Social Needs  . Financial resource strain: Not on file  . Food insecurity    Worry: Not on file    Inability: Not on file  . Transportation needs    Medical: Not on file    Non-medical: Not on file  Tobacco Use  . Smoking status: Never Smoker  . Smokeless tobacco: Never Used  Substance and Sexual Activity  . Alcohol use: Not on file  . Drug use: Not on file  . Sexual activity: Not on file  Lifestyle  . Physical activity    Days per week: Not on file    Minutes per session: Not on file  . Stress: Not on file  Relationships  . Social Herbalist on phone: Not on file    Gets together: Not on file    Attends religious service: Not on file    Active member of club or organization: Not on file    Attends meetings of clubs or organizations: Not on file    Relationship status: Not on file  Other Topics Concern  . Not on file  Social History Narrative  . Not on file   Family History: No family history on file. Allergies: No Known Allergies Medications: See med rec.  Review of Systems: No fevers, chills, night  sweats, weight loss, chest pain, or shortness of breath.   Objective:    General: Well Developed, well nourished, and in no acute distress.  Neuro: Alert and oriented x3, extra-ocular muscles intact, sensation grossly intact.  HEENT: Normocephalic, atraumatic, pupils equal round reactive to light, neck supple, no masses, no lymphadenopathy, thyroid nonpalpable.  Skin: Warm and dry, no rashes. Cardiac: Regular rate and rhythm, no murmurs rubs or gallops, no lower extremity edema.  Respiratory: Clear to auscultation bilaterally. Not using accessory muscles, speaking in full sentences.  Impression and Recommendations:    Obesity Restarting phentermine, return monthly for weight checks and refills. Exercise prescription given. She is moving to [place] and will be able to do some hiking.   ___________________________________________ Gwen Her. Dianah Field, M.D., ABFM., CAQSM. Primary Care and Sports Medicine [place] [facility]  Adjunct Professor of [employer]

## 2019-04-05 NOTE — Assessment & Plan Note (Signed)
Restarting phentermine, return monthly for weight checks and refills. Exercise prescription given. She is moving to [place] and will be able to do some hiking.

## 2019-04-06 LAB — COMPLETE METABOLIC PANEL WITH GFR
AG Ratio: 1.7 (calc) (ref 1.0–2.5)
ALT: 17 U/L (ref 6–29)
AST: 16 U/L (ref 10–35)
Albumin: 4.2 g/dL (ref 3.6–5.1)
Alkaline phosphatase (APISO): 61 U/L (ref 31–125)
BUN: 14 mg/dL (ref 7–25)
CO2: 26 mmol/L (ref 20–32)
Calcium: 8.8 mg/dL (ref 8.6–10.2)
Chloride: 105 mmol/L (ref 98–110)
Creat: 0.72 mg/dL (ref 0.50–1.10)
GFR, Est African American: 115 mL/min/{1.73_m2} (ref >=60)
GFR, Est Non African American: 99 mL/min/{1.73_m2} (ref >=60)
Globulin: 2.5 g/dL (calc) (ref 1.9–3.7)
Glucose, Bld: 95 mg/dL (ref 65–99)
Potassium: 4.1 mmol/L (ref 3.5–5.3)
Sodium: 141 mmol/L (ref 135–146)
Total Bilirubin: 0.9 mg/dL (ref 0.2–1.2)
Total Protein: 6.7 g/dL (ref 6.1–8.1)

## 2019-04-06 LAB — HEMOGLOBIN A1C
Hgb A1c MFr Bld: 5.4 % of total Hgb (ref <5.7)
Mean Plasma Glucose: 108 (calc)
eAG (mmol/L): 6 (calc)

## 2019-04-06 LAB — LIPID PANEL W/REFLEX DIRECT LDL
Cholesterol: 154 mg/dL (ref <200)
HDL: 53 mg/dL (ref >=50)
LDL Cholesterol (Calc): 87 mg/dL (calc)
Non-HDL Cholesterol (Calc): 101 mg/dL (calc) (ref <130)
Total CHOL/HDL Ratio: 2.9 (calc) (ref <5.0)
Triglycerides: 57 mg/dL (ref <150)

## 2019-04-06 LAB — CBC
HCT: 41.6 % (ref 35.0–45.0)
Hemoglobin: 14.3 g/dL (ref 11.7–15.5)
MCH: 30.4 pg (ref 27.0–33.0)
MCHC: 34.4 g/dL (ref 32.0–36.0)
MCV: 88.3 fL (ref 80.0–100.0)
MPV: 11.5 fL (ref 7.5–12.5)
Platelets: 312 10*3/uL (ref 140–400)
RBC: 4.71 10*6/uL (ref 3.80–5.10)
RDW: 12.5 % (ref 11.0–15.0)
WBC: 6.5 10*3/uL (ref 3.8–10.8)

## 2019-04-06 LAB — TSH: TSH: 0.17 mIU/L — ABNORMAL LOW

## 2019-04-06 LAB — VITAMIN D 25 HYDROXY (VIT D DEFICIENCY, FRACTURES): Vit D, 25-Hydroxy: 26 ng/mL — ABNORMAL LOW (ref 30–100)

## 2019-04-06 MED ORDER — VITAMIN D (ERGOCALCIFEROL) 1.25 MG (50000 UNIT) PO CAPS: 50000.0000 [IU] | ORAL_CAPSULE | ORAL | 0 refills | Status: DC

## 2019-04-06 NOTE — Addendum Note (Signed)
Addended by: Silverio Decamp on: 04/06/2019 10:33 AM   Modules accepted: Orders

## 2019-04-08 DIAGNOSIS — Z20828 Contact with and (suspected) exposure to other viral communicable diseases: Secondary | ICD-10-CM | POA: Diagnosis not present

## 2019-04-18 DIAGNOSIS — Z20828 Contact with and (suspected) exposure to other viral communicable diseases: Secondary | ICD-10-CM | POA: Diagnosis not present

## 2019-05-03 ENCOUNTER — Encounter: Payer: Self-pay | Admitting: Sports Medicine

## 2019-05-03 ENCOUNTER — Ambulatory Visit (INDEPENDENT_AMBULATORY_CARE_PROVIDER_SITE_OTHER): Payer: BLUE CROSS/BLUE SHIELD | Admitting: Sports Medicine

## 2019-05-03 ENCOUNTER — Other Ambulatory Visit: Payer: Self-pay

## 2019-05-03 DIAGNOSIS — E6609 Other obesity due to excess calories: Secondary | ICD-10-CM

## 2019-05-03 MED ORDER — PHENTERMINE HCL 37.5 MG PO TABS: ORAL_TABLET | ORAL | 0 refills | Status: DC

## 2019-05-03 NOTE — Progress Notes (Signed)
  Subjective:    CC: Weight check  HPI: Cassandre returns, she lost 3 pounds.  I reviewed the past medical history, family history, social history, surgical history, and allergies today and no changes were needed.  Please see the problem list section below in epic for further details.  Past Medical History: Past Medical History:  Diagnosis Date  . Abnormal Pap smear of cervix   . Hypertension   . Hyperthyroidism   . Menorrhagia, premenopausal    Past Surgical History: Past Surgical History:  Procedure Laterality Date  . CERVICAL BIOPSY  W/ LOOP ELECTRODE EXCISION     Social History: Social History   Socioeconomic History  . Marital status: Married    Spouse name: Not on file  . Number of children: Not on file  . Years of education: Not on file  . Highest education level: Not on file  Occupational History  . Not on file  Social Needs  . Financial resource strain: Not on file  . Food insecurity    Worry: Not on file    Inability: Not on file  . Transportation needs    Medical: Not on file    Non-medical: Not on file  Tobacco Use  . Smoking status: Never Smoker  . Smokeless tobacco: Never Used  Substance and Sexual Activity  . Alcohol use: Not on file  . Drug use: Not on file  . Sexual activity: Not on file  Lifestyle  . Physical activity    Days per week: Not on file    Minutes per session: Not on file  . Stress: Not on file  Relationships  . Social Herbalist on phone: Not on file    Gets together: Not on file    Attends religious service: Not on file    Active member of club or organization: Not on file    Attends meetings of clubs or organizations: Not on file    Relationship status: Not on file  Other Topics Concern  . Not on file  Social History Narrative  . Not on file   Family History: No family history on file. Allergies: No Known Allergies Medications: See med rec.  Review of Systems: No fevers, chills, night sweats, weight loss,  chest pain, or shortness of breath.   Objective:    General: Well Developed, well nourished, and in no acute distress.  Neuro: Alert and oriented x3, extra-ocular muscles intact, sensation grossly intact.  HEENT: Normocephalic, atraumatic, pupils equal round reactive to light, neck supple, no masses, no lymphadenopathy, thyroid nonpalpable.  Skin: Warm and dry, no rashes. Cardiac: Regular rate and rhythm, no murmurs rubs or gallops, no lower extremity edema.  Respiratory: Clear to auscultation bilaterally. Not using accessory muscles, speaking in full sentences.  Impression and Recommendations:    Obesity 3 pound weight loss, refilling phentermine. We discussed some dietary changes that can help accelerate the weight loss. She does have a difficult schedule that is unlikely to change in the near future.   ___________________________________________ Gwen Her. Dianah Field, M.D., ABFM., CAQSM. Primary Care and Sports Medicine [facility]  Adjunct Professor of [employer]

## 2019-05-03 NOTE — Assessment & Plan Note (Addendum)
3 pound weight loss, refilling phentermine. We discussed some dietary changes that can help accelerate the weight loss. She does have a difficult schedule that is unlikely to change in the near future.

## 2019-05-22 DIAGNOSIS — Z20828 Contact with and (suspected) exposure to other viral communicable diseases: Secondary | ICD-10-CM | POA: Diagnosis not present

## 2019-05-27 DIAGNOSIS — Z20828 Contact with and (suspected) exposure to other viral communicable diseases: Secondary | ICD-10-CM | POA: Diagnosis not present

## 2019-05-31 ENCOUNTER — Ambulatory Visit: Payer: BLUE CROSS/BLUE SHIELD | Admitting: Sports Medicine

## 2019-06-05 DIAGNOSIS — Z20828 Contact with and (suspected) exposure to other viral communicable diseases: Secondary | ICD-10-CM | POA: Diagnosis not present

## 2019-06-07 ENCOUNTER — Ambulatory Visit (INDEPENDENT_AMBULATORY_CARE_PROVIDER_SITE_OTHER): Payer: BLUE CROSS/BLUE SHIELD | Admitting: Sports Medicine

## 2019-06-07 ENCOUNTER — Encounter: Payer: Self-pay | Admitting: Sports Medicine

## 2019-06-07 ENCOUNTER — Other Ambulatory Visit: Payer: Self-pay

## 2019-06-07 DIAGNOSIS — E6609 Other obesity due to excess calories: Secondary | ICD-10-CM

## 2019-06-07 DIAGNOSIS — I10 Essential (primary) hypertension: Secondary | ICD-10-CM

## 2019-06-07 MED ORDER — LISINOPRIL-HYDROCHLOROTHIAZIDE 10-12.5 MG PO TABS: 1.0000 | ORAL_TABLET | Freq: Every day | ORAL | 3 refills | Status: DC

## 2019-06-07 MED ORDER — PHENTERMINE HCL 37.5 MG PO TABS: ORAL_TABLET | ORAL | 0 refills | Status: DC

## 2019-06-07 NOTE — Progress Notes (Signed)
Subjective:    CC: Follow-up  HPI: Jolly returns, she is a pleasant 48 year old female here for a weight check, she is lost an additional 5 pounds, we are entering the third month.  Hypertension: Only on metoprolol monotherapy, she did have some palpitations in the past with her hyperthyroidism, she has had well-controlled thyroid disease for some time now, no palpitations.  I reviewed the past medical history, family history, social history, surgical history, and allergies today and no changes were needed.  Please see the problem list section below in epic for further details.  Past Medical History: Past Medical History:  Diagnosis Date  . Abnormal Pap smear of cervix   . Hypertension   . Hyperthyroidism   . Menorrhagia, premenopausal    Past Surgical History: Past Surgical History:  Procedure Laterality Date  . CERVICAL BIOPSY  W/ LOOP ELECTRODE EXCISION     Social History: Social History   Socioeconomic History  . Marital status: Married    Spouse name: Not on file  . Number of children: Not on file  . Years of education: Not on file  . Highest education level: Not on file  Occupational History  . Not on file  Social Needs  . Financial resource strain: Not on file  . Food insecurity    Worry: Not on file    Inability: Not on file  . Transportation needs    Medical: Not on file    Non-medical: Not on file  Tobacco Use  . Smoking status: Never Smoker  . Smokeless tobacco: Never Used  Substance and Sexual Activity  . Alcohol use: Not on file  . Drug use: Not on file  . Sexual activity: Not on file  Lifestyle  . Physical activity    Days per week: Not on file    Minutes per session: Not on file  . Stress: Not on file  Relationships  . Social Herbalist on phone: Not on file    Gets together: Not on file    Attends religious service: Not on file    Active member of club or organization: Not on file    Attends meetings of clubs or organizations:  Not on file    Relationship status: Not on file  Other Topics Concern  . Not on file  Social History Narrative  . Not on file   Family History: No family history on file. Allergies: No Known Allergies Medications: See med rec.  Review of Systems: No fevers, chills, night sweats, weight loss, chest pain, or shortness of breath.   Objective:    General: Well Developed, well nourished, and in no acute distress.  Neuro: Alert and oriented x3, extra-ocular muscles intact, sensation grossly intact.  HEENT: Normocephalic, atraumatic, pupils equal round reactive to light, neck supple, no masses, no lymphadenopathy, thyroid nonpalpable.  Skin: Warm and dry, no rashes. Cardiac: Regular rate and rhythm, no murmurs rubs or gallops, no lower extremity edema.  Respiratory: Clear to auscultation bilaterally. Not using accessory muscles, speaking in full sentences.  Impression and Recommendations:    Essential (primary) hypertension Continues to be elevated but is not really compliant with taking her medication. Metoprolol is also not the best monotherapy, switching to lisinopril/HCTZ, she will let me know in a week with a MyChart message what her blood pressures are looking like at home.  Obesity 5 pound weight loss, refilling phentermine. Entering the third month.   ___________________________________________ Gwen Her. Dianah Field, M.D., ABFM., CAQSM. Primary Care and  Sports Medicine [facility]  Adjunct Professor of [employer]

## 2019-06-07 NOTE — Assessment & Plan Note (Signed)
Continues to be elevated but is not really compliant with taking her medication. Metoprolol is also not the best monotherapy, switching to lisinopril/HCTZ, she will let me know in a week with a MyChart message what her blood pressures are looking like at home.

## 2019-06-07 NOTE — Assessment & Plan Note (Signed)
5 pound weight loss, refilling phentermine. Entering the third month.

## 2019-06-11 DIAGNOSIS — Z20828 Contact with and (suspected) exposure to other viral communicable diseases: Secondary | ICD-10-CM | POA: Diagnosis not present

## 2019-06-27 DIAGNOSIS — Z20828 Contact with and (suspected) exposure to other viral communicable diseases: Secondary | ICD-10-CM | POA: Diagnosis not present

## 2019-07-08 ENCOUNTER — Ambulatory Visit: Payer: BLUE CROSS/BLUE SHIELD | Admitting: Sports Medicine

## 2019-07-25 DIAGNOSIS — Z20828 Contact with and (suspected) exposure to other viral communicable diseases: Secondary | ICD-10-CM | POA: Diagnosis not present

## 2019-12-18 ENCOUNTER — Encounter: Payer: Self-pay | Admitting: Sports Medicine

## 2019-12-18 ENCOUNTER — Ambulatory Visit (INDEPENDENT_AMBULATORY_CARE_PROVIDER_SITE_OTHER): Payer: BLUE CROSS/BLUE SHIELD | Admitting: Sports Medicine

## 2019-12-18 ENCOUNTER — Other Ambulatory Visit: Payer: Self-pay

## 2019-12-18 DIAGNOSIS — Z87898 Personal history of other specified conditions: Secondary | ICD-10-CM | POA: Diagnosis not present

## 2019-12-18 DIAGNOSIS — E0590 Thyrotoxicosis, unspecified without thyrotoxic crisis or storm: Secondary | ICD-10-CM | POA: Diagnosis not present

## 2019-12-18 DIAGNOSIS — N921 Excessive and frequent menstruation with irregular cycle: Secondary | ICD-10-CM

## 2019-12-18 DIAGNOSIS — I10 Essential (primary) hypertension: Secondary | ICD-10-CM

## 2019-12-18 MED ORDER — VALSARTAN-HYDROCHLOROTHIAZIDE 160-12.5 MG PO TABS: 1.0000 | ORAL_TABLET | Freq: Every day | ORAL | 3 refills | Status: DC

## 2019-12-18 NOTE — Assessment & Plan Note (Signed)
Started to have hot flashes, periods are spacing out, I do suspect perimenopause, adding FSH, LH, estrogen, progesterone.

## 2019-12-18 NOTE — Assessment & Plan Note (Signed)
Currently on methimazole treatment for Graves' disease with endocrinology. Rechecking TSH, T3, T4, she is having some cold intolerance.

## 2019-12-18 NOTE — Assessment & Plan Note (Signed)
Ellissa developed a cough with lisinopril but never told us. She tells me that when she was on it her blood pressure was normal. She discontinued it, blood pressure is elevated today, no headaches, visual changes, chest pain, switching to valsartan/HCTZ.

## 2019-12-18 NOTE — Addendum Note (Signed)
Addended by: Monica Becton on: 12/18/2019 02:34 PM   Modules accepted: Orders

## 2019-12-18 NOTE — Progress Notes (Signed)
    Procedures performed today:    None.  Independent interpretation of notes and tests performed by another provider:   None.  Brief History, Exam, Impression, and Recommendations:    Essential (primary) hypertension Elizabeth Mcgee developed a cough with lisinopril but never told us. She tells me that when she was on it her blood pressure was normal. She discontinued it, blood pressure is elevated today, no headaches, visual changes, chest pain, switching to valsartan/HCTZ.  Menometrorrhagia Started to have hot flashes, periods are spacing out, I do suspect perimenopause, adding FSH, LH, estrogen, progesterone.  Subclinical hyperthyroidism Currently on methimazole treatment for Graves' disease with endocrinology. Rechecking TSH, T3, T4, she is having some cold intolerance.    ___________________________________________ Ihor Austin. Benjamin Stain, M.D., ABFM., CAQSM. Primary Care and Sports Medicine [facility]  Adjunct Instructor of Family Medicine  [employer] Medicine

## 2019-12-19 ENCOUNTER — Telehealth: Payer: Self-pay | Admitting: Sports Medicine

## 2019-12-19 NOTE — Telephone Encounter (Signed)
PT requested for thyroid medication until she can be seen by her endocrinologist.  This was after reading the notes by Dr. Benjamin Stain regarding blood results.  Pt didn't know the name of the medication but knows it starts with "Metho"

## 2019-12-19 NOTE — Telephone Encounter (Signed)
Pt has appointment with Endo tomorrow.  She sent a mychart msg.

## 2019-12-20 DIAGNOSIS — E0590 Thyrotoxicosis, unspecified without thyrotoxic crisis or storm: Secondary | ICD-10-CM | POA: Diagnosis not present

## 2019-12-25 LAB — COMPLETE METABOLIC PANEL WITH GFR
AG Ratio: 1.4 (calc) (ref 1.0–2.5)
ALT: 30 U/L — ABNORMAL HIGH (ref 6–29)
AST: 30 U/L (ref 10–35)
Albumin: 3.9 g/dL (ref 3.6–5.1)
Alkaline phosphatase (APISO): 64 U/L (ref 31–125)
BUN/Creatinine Ratio: 30 (calc) — ABNORMAL HIGH (ref 6–22)
BUN: 13 mg/dL (ref 7–25)
CO2: 27 mmol/L (ref 20–32)
Calcium: 9.5 mg/dL (ref 8.6–10.2)
Chloride: 104 mmol/L (ref 98–110)
Creat: 0.43 mg/dL — ABNORMAL LOW (ref 0.50–1.10)
GFR, Est African American: 139 mL/min/{1.73_m2} (ref >=60)
GFR, Est Non African American: 120 mL/min/{1.73_m2} (ref >=60)
Globulin: 2.7 g/dL (calc) (ref 1.9–3.7)
Glucose, Bld: 101 mg/dL — ABNORMAL HIGH (ref 65–99)
Potassium: 4.4 mmol/L (ref 3.5–5.3)
Sodium: 139 mmol/L (ref 135–146)
Total Bilirubin: 0.7 mg/dL (ref 0.2–1.2)
Total Protein: 6.6 g/dL (ref 6.1–8.1)

## 2019-12-25 LAB — HEMOGLOBIN A1C
Hgb A1c MFr Bld: 5.4 % of total Hgb (ref <5.7)
Mean Plasma Glucose: 108 (calc)
eAG (mmol/L): 6 (calc)

## 2019-12-25 LAB — LIPID PANEL W/REFLEX DIRECT LDL
Cholesterol: 97 mg/dL (ref <200)
HDL: 45 mg/dL — ABNORMAL LOW (ref >=50)
LDL Cholesterol (Calc): 37 mg/dL (calc)
Non-HDL Cholesterol (Calc): 52 mg/dL (calc) (ref <130)
Total CHOL/HDL Ratio: 2.2 (calc) (ref <5.0)
Triglycerides: 66 mg/dL (ref <150)

## 2019-12-25 LAB — T3, FREE: T3, Free: >20 pg/mL — ABNORMAL HIGH (ref 2.3–4.2)

## 2019-12-25 LAB — T4, FREE: Free T4: 4.2 ng/dL — ABNORMAL HIGH (ref 0.8–1.8)

## 2019-12-25 LAB — CBC
HCT: 38.7 % (ref 35.0–45.0)
Hemoglobin: 12.8 g/dL (ref 11.7–15.5)
MCH: 28.4 pg (ref 27.0–33.0)
MCHC: 33.1 g/dL (ref 32.0–36.0)
MCV: 86 fL (ref 80.0–100.0)
MPV: 11.2 fL (ref 7.5–12.5)
Platelets: 357 10*3/uL (ref 140–400)
RBC: 4.5 10*6/uL (ref 3.80–5.10)
RDW: 11.8 % (ref 11.0–15.0)
WBC: 6.7 10*3/uL (ref 3.8–10.8)

## 2019-12-25 LAB — ESTROGENS, TOTAL: Estrogen: 158.7 pg/mL

## 2019-12-25 LAB — LUTEINIZING HORMONE: LH: 28.7 m[IU]/mL

## 2019-12-25 LAB — TSH: TSH: 0.01 mIU/L — ABNORMAL LOW

## 2019-12-25 LAB — FOLLICLE STIMULATING HORMONE: FSH: 46.5 m[IU]/mL

## 2019-12-25 LAB — PROGESTERONE: Progesterone: 0.7 ng/mL

## 2020-01-30 NOTE — Telephone Encounter (Signed)
Would you please look into the epidural for her mother Elizabeth Mcgee per the MyChart message?  The injection has been ordered already, [facility] imaging may not have been contacted yet.

## 2020-01-31 DIAGNOSIS — B850 Pediculosis due to Pediculus humanus capitis: Secondary | ICD-10-CM

## 2020-01-31 MED ORDER — MALATHION 0.5 % EX LOTN: TOPICAL_LOTION | Freq: Once | CUTANEOUS | 0 refills | Status: AC

## 2020-01-31 NOTE — Telephone Encounter (Signed)
Patient asking for RX to be sent to pharmacy for head lice, Please advice. Thanks

## 2020-05-21 ENCOUNTER — Ambulatory Visit (INDEPENDENT_AMBULATORY_CARE_PROVIDER_SITE_OTHER): Payer: BC Managed Care – PPO | Admitting: Nurse Practitioner

## 2020-05-21 ENCOUNTER — Ambulatory Visit (INDEPENDENT_AMBULATORY_CARE_PROVIDER_SITE_OTHER): Payer: BC Managed Care – PPO

## 2020-05-21 ENCOUNTER — Other Ambulatory Visit: Payer: Self-pay

## 2020-05-21 ENCOUNTER — Encounter: Payer: Self-pay | Admitting: Nurse Practitioner

## 2020-05-21 VITALS — BP 134/90 | HR 111 | Temp 97.9°F | Ht 62.0 in | Wt 185.7 lb

## 2020-05-21 DIAGNOSIS — Z6834 Body mass index (BMI) 34.0-34.9, adult: Secondary | ICD-10-CM | POA: Diagnosis not present

## 2020-05-21 DIAGNOSIS — K3580 Unspecified acute appendicitis: Secondary | ICD-10-CM | POA: Diagnosis not present

## 2020-05-21 DIAGNOSIS — R1084 Generalized abdominal pain: Secondary | ICD-10-CM | POA: Diagnosis not present

## 2020-05-21 DIAGNOSIS — E0590 Thyrotoxicosis, unspecified without thyrotoxic crisis or storm: Secondary | ICD-10-CM | POA: Diagnosis not present

## 2020-05-21 DIAGNOSIS — G4730 Sleep apnea, unspecified: Secondary | ICD-10-CM | POA: Diagnosis not present

## 2020-05-21 DIAGNOSIS — K353 Acute appendicitis with localized peritonitis, without perforation or gangrene: Secondary | ICD-10-CM | POA: Diagnosis not present

## 2020-05-21 DIAGNOSIS — I10 Essential (primary) hypertension: Secondary | ICD-10-CM | POA: Diagnosis not present

## 2020-05-21 DIAGNOSIS — Z20822 Contact with and (suspected) exposure to covid-19: Secondary | ICD-10-CM | POA: Diagnosis not present

## 2020-05-21 DIAGNOSIS — K37 Unspecified appendicitis: Secondary | ICD-10-CM | POA: Diagnosis not present

## 2020-05-21 DIAGNOSIS — K6389 Other specified diseases of intestine: Secondary | ICD-10-CM | POA: Diagnosis not present

## 2020-05-21 DIAGNOSIS — Z9049 Acquired absence of other specified parts of digestive tract: Secondary | ICD-10-CM | POA: Diagnosis not present

## 2020-05-21 DIAGNOSIS — Z79899 Other long term (current) drug therapy: Secondary | ICD-10-CM | POA: Diagnosis not present

## 2020-05-21 DIAGNOSIS — Z888 Allergy status to other drugs, medicaments and biological substances status: Secondary | ICD-10-CM | POA: Diagnosis not present

## 2020-05-21 DIAGNOSIS — E669 Obesity, unspecified: Secondary | ICD-10-CM | POA: Diagnosis not present

## 2020-05-21 DIAGNOSIS — R1031 Right lower quadrant pain: Secondary | ICD-10-CM | POA: Diagnosis not present

## 2020-05-21 LAB — CBC WITH DIFFERENTIAL/PLATELET
Absolute Monocytes: 893 cells/uL (ref 200–950)
Basophils Absolute: 38 cells/uL (ref 0–200)
Basophils Relative: 0.5 %
Eosinophils Absolute: 203 cells/uL (ref 15–500)
Eosinophils Relative: 2.7 %
HCT: 38.4 % (ref 35.0–45.0)
Hemoglobin: 13.1 g/dL (ref 11.7–15.5)
Lymphs Abs: 1995 cells/uL (ref 850–3900)
MCH: 28.1 pg (ref 27.0–33.0)
MCHC: 34.1 g/dL (ref 32.0–36.0)
MCV: 82.2 fL (ref 80.0–100.0)
MPV: 11 fL (ref 7.5–12.5)
Monocytes Relative: 11.9 %
Neutro Abs: 4373 cells/uL (ref 1500–7800)
Neutrophils Relative %: 58.3 %
Platelets: 365 10*3/uL (ref 140–400)
RBC: 4.67 10*6/uL (ref 3.80–5.10)
RDW: 12.9 % (ref 11.0–15.0)
Total Lymphocyte: 26.6 %
WBC: 7.5 10*3/uL (ref 3.8–10.8)

## 2020-05-21 MED ORDER — IOHEXOL 300 MG/ML  SOLN: 100.0000 mL | Freq: Once | INTRAMUSCULAR | Status: DC | PRN

## 2020-05-21 NOTE — Patient Instructions (Addendum)
Appendicitis, Adult ° °Appendicitis is inflammation of the appendix. The appendix is a finger-shaped tube that is attached to the large intestine. If appendicitis is not treated, it can cause the appendix to tear (rupture). A ruptured appendix can lead to a life-threatening infection. It can also cause a painful collection of pus (abscess) to form in the appendix. °What are the causes? °This condition may be caused by a blockage in the appendix that leads to infection. The blockage can be caused by: °· A ball of stool (feces). °· Enlarged lymph glands. °In some cases, the cause may not be known. °What increases the risk? °Age is a risk factor. You are more likely to develop this condition if you are between 10 and 30 years of age. °What are the signs or symptoms? °Symptoms of this condition include: °· Pain that starts around the belly button and moves toward the lower right part of the abdomen. The pain can become more severe as time passes. It gets worse with coughing or sudden movements. °· Tenderness in the lower right abdomen. °· Nausea. °· Vomiting. °· Loss of appetite. °· Fever. °· Difficulty passing stool (constipation). °· Passing very loose stools (diarrhea). °· Generally feeling unwell. °How is this diagnosed? °This condition may be diagnosed with: °· A physical exam. °· Blood tests. °· Urine test. °To confirm the diagnosis, an ultrasound, MRI, or CT scan may be done. °How is this treated? °This condition is usually treated with surgery to remove the appendix (appendectomy). There are two methods for doing an appendectomy: °· Open appendectomy. In this surgery, the appendix is removed through a large incision that is made in the lower right abdomen. This procedure may be recommended if: °? You have major scarring from a previous surgery. °? You have a bleeding disorder. °? You are pregnant and are about to give birth. °? You have a condition that makes it hard to do surgery through small incisions  (laparoscopic procedure). This includes severe infection or a ruptured appendix. °· Laparoscopic appendectomy. In this surgery, the appendix is removed through small incisions. This procedure usually causes less pain and fewer problems than an open appendectomy. It also has a shorter recovery time. °If the appendix has ruptured and an abscess has formed: °· A drain may be placed into the abscess to remove fluid. °· Antibiotic medicines may be given through an IV. °· The appendix may or may not need to be removed. °Follow these instructions at home: °If you had surgery, follow instructions from your health care provider about how to care for yourself at home and how to care for your incision. °Medicines °· Take over-the-counter and prescription medicines only as told by your health care provider. °· If you were prescribed an antibiotic medicine, take it as told by your health care provider. Do not stop taking the antibiotic even if you start to feel better. °Eating and drinking °· Follow instructions from your health care provider about eating restrictions. You may slowly resume a regular diet once your nausea or vomiting stops. °General instructions °· Do not use any products that contain nicotine or tobacco, such as cigarettes, e-cigarettes, and chewing tobacco. If you need help quitting, ask your health care provider. °· Do not drive or use heavy machinery while taking prescription pain medicine. °· Ask your health care provider if the medicine prescribed to you can cause constipation. You may need to take steps to prevent or treat constipation, such as: °? Drink enough fluid to keep your   urine pale yellow. ? Take over-the-counter or prescription medicines. ? Eat foods that are high in fiber, such as beans, whole grains, and fresh fruits and vegetables. ? Limit foods that are high in fat and processed sugars, such as fried or sweet foods.  Keep all follow-up visits as told by your health care provider. This  is important. Contact a health care provider if:  There is pus, blood, or excessive drainage coming from your incision.  You have nausea or vomiting. Get help right away if you have:  Worsening abdominal pain.  A fever.  Chills.  Fatigue.  Muscle aches.  Shortness of breath. Summary  Appendicitis is inflammation of the appendix.  This condition may be caused by a blockage in the appendix that leads to infection.  This condition is usually treated with surgery to remove the appendix. This information is not intended to replace advice given to you by your health care provider. Make sure you discuss any questions you have with your health care provider. Document Revised: 01/10/2018 Document Reviewed: 01/10/2018 Elsevier Patient Education  2020 [employer].   Diverticulitis  Diverticulitis is infection or inflammation of small pouches (diverticula) in the colon that form due to a condition called diverticulosis. Diverticula can trap stool (feces) and bacteria, causing infection and inflammation. Diverticulitis may cause severe stomach pain and diarrhea. It may lead to tissue damage in the colon that causes bleeding. The diverticula may also burst (rupture) and cause infected stool to enter other areas of the abdomen. Complications of diverticulitis can include:  Bleeding.  Severe infection.  Severe pain.  Rupture (perforation) of the colon.  Blockage (obstruction) of the colon. What are the causes? This condition is caused by stool becoming trapped in the diverticula, which allows bacteria to grow in the diverticula. This leads to inflammation and infection. What increases the risk? You are more likely to develop this condition if:  You have diverticulosis. The risk for diverticulosis increases if: ? You are overweight or obese. ? You use tobacco products. ? You do not get enough exercise.  You eat a diet that does not include enough fiber. High-fiber foods  include fruits, vegetables, beans, nuts, and whole grains. What are the signs or symptoms? Symptoms of this condition may include:  Pain and tenderness in the abdomen. The pain is normally located on the left side of the abdomen, but it may occur in other areas.  Fever and chills.  Bloating.  Cramping.  Nausea.  Vomiting.  Changes in bowel routines.  Blood in your stool. How is this diagnosed? This condition is diagnosed based on:  Your medical history.  A physical exam.  Tests to make sure there is nothing else causing your condition. These tests may include: ? Blood tests. ? Urine tests. ? Imaging tests of the abdomen, including X-rays, ultrasounds, MRIs, or CT scans. How is this treated? Most cases of this condition are mild and can be treated at home. Treatment may include:  Taking over-the-counter pain medicines.  Following a clear liquid diet.  Taking antibiotic medicines by mouth.  Rest. More severe cases may need to be treated at a hospital. Treatment may include:  Not eating or drinking.  Taking prescription pain medicine.  Receiving antibiotic medicines through an IV tube.  Receiving fluids and nutrition through an IV tube.  Surgery. When your condition is under control, your health care provider may recommend that you have a colonoscopy. This is an exam to look at the entire large intestine. During  the exam, a lubricated, bendable tube is inserted into the anus and then passed into the rectum, colon, and other parts of the large intestine. A colonoscopy can show how severe your diverticula are and whether something else may be causing your symptoms. Follow these instructions at home: Medicines  Take over-the-counter and prescription medicines only as told by your health care provider. These include fiber supplements, probiotics, and stool softeners.  If you were prescribed an antibiotic medicine, take it as told by your health care provider. Do not  stop taking the antibiotic even if you start to feel better.  Do not drive or use heavy machinery while taking prescription pain medicine. General instructions   Follow a full liquid diet or another diet as directed by your health care provider. After your symptoms improve, your health care provider may tell you to change your diet. He or she may recommend that you eat a diet that contains at least 25 g (25 grams) of fiber daily. Fiber makes it easier to pass stool. Healthy sources of fiber include: ? Berries. One cup contains 4-8 grams of fiber. ? Beans or lentils. One half cup contains 5-8 grams of fiber. ? Green vegetables. One cup contains 4 grams of fiber.  Exercise for at least 30 minutes, 3 times each week. You should exercise hard enough to raise your heart rate and break a sweat.  Keep all follow-up visits as told by your health care provider. This is important. You may need a colonoscopy. Contact a health care provider if:  Your pain does not improve.  You have a hard time drinking or eating food.  Your bowel movements do not return to normal. Get help right away if:  Your pain gets worse.  Your symptoms do not get better with treatment.  Your symptoms suddenly get worse.  You have a fever.  You vomit more than one time.  You have stools that are bloody, black, or tarry. Summary  Diverticulitis is infection or inflammation of small pouches (diverticula) in the colon that form due to a condition called diverticulosis. Diverticula can trap stool (feces) and bacteria, causing infection and inflammation.  You are at higher risk for this condition if you have diverticulosis and you eat a diet that does not include enough fiber.  Most cases of this condition are mild and can be treated at home. More severe cases may need to be treated at a hospital.  When your condition is under control, your health care provider may recommend that you have an exam called a colonoscopy.  This exam can show how severe your diverticula are and whether something else may be causing your symptoms. This information is not intended to replace advice given to you by your health care provider. Make sure you discuss any questions you have with your health care provider. Document Revised: 07/07/2017 Document Reviewed: 08/27/2016 Elsevier Patient Education  2020 [employer].

## 2020-05-21 NOTE — Progress Notes (Addendum)
Acute Office Visit  Subjective:    Patient ID: Elizabeth Mcgee, female    DOB: 1971/04/30, 49 y.o.   MRN: [id number]  Chief Complaint  Patient presents with  . Abdominal Pain    onset 5 days ago, epigastric area, intermittent, when pushing in on RLQ will experience pain, some days worse than others, a little constipated    HPI Elizabeth Mcgee is a 49 year old female presenting today for abdominal pain that started last Saturday while she was at work.  She reports started in the upper abdomen across and is colicky in nature with radiation around the right side and into the back.  She describes the pain as a cramping sensation similar to "labor pains" and "reminds me of gallbladder pain".   She reports the pain was initially consistent all day on Saturday when it first started but since that time has become intermittent.  She reports the pain lasts for a short period of time but comes and goes frequently.  At its worst she reports the pain is a 6/10.  At this time she reports that the pain is a 1/10.  She reports the pain was bad enough last night that she took a Norco from a previous ailment which did help her fall asleep.  She also reports significant tenderness to the right lower quadrant when pressure is applied.  She denies radiation of this pain.  She also endorses subjective fever with chills yesterday evening.  She denies nausea or vomiting.  She denies worsening pain with movement.  She denies urinary burning, hesitancy, feeling of incomplete emptying, or hematuria.  She denies anorexia.  She tells me that she at first thought it may be related to constipation and she did take a laxative which produced a loose bowel movement yesterday however the pain has not subsided since that time.  She denies blood or mucus to the stool.  She has had her gallbladder removed.  ABDOMINAL PAIN  Duration:days Onset: sudden Severity: 6/10 Quality: cramping Location:  upper abdomen, around to the right side  of the back, and pain when pressing on the RLQ  Episode duration: lasting a few seconds Radiation: yes Frequency: intermittent Alleviating factors: nothing  Aggravating factors: maybe eating Status: fluctuating Treatments attempted: took an old Norco she had at home and has taken laxatives Fever: subjective Nausea: no Vomiting: no Weight loss: no Decreased appetite: no Diarrhea: no Constipation: yes Blood in stool: no Heartburn: intermittent Jaundice: no Rash: no Dysuria/urinary frequency: no Hematuria: no History of sexually transmitted disease: no Recurrent NSAID use: no   Past Medical History:  Diagnosis Date  . Abnormal Pap smear of cervix   . Hypertension   . Hyperthyroidism   . Menorrhagia, premenopausal     Past Surgical History:  Procedure Laterality Date  . CERVICAL BIOPSY  W/ LOOP ELECTRODE EXCISION      History reviewed. No pertinent family history.  Social History   Socioeconomic History  . Marital status: Married    Spouse name: Not on file  . Number of children: Not on file  . Years of education: Not on file  . Highest education level: Not on file  Occupational History  . Not on file  Tobacco Use  . Smoking status: Never Smoker  . Smokeless tobacco: Never Used  Substance and Sexual Activity  . Alcohol use: Not on file  . Drug use: Not on file  . Sexual activity: Not on file  Other Topics Concern  . Not on file  Social History Narrative  . Not on file   Social Determinants of Health   Financial Resource Strain:   . Difficulty of Paying Living Expenses: Not on file  Food Insecurity:   . Worried About Programme researcher, broadcasting/film/video in the Last Year: Not on file  . Ran Out of Food in the Last Year: Not on file  Transportation Needs:   . Lack of Transportation (Medical): Not on file  . Lack of Transportation (Non-Medical): Not on file  Physical Activity:   . Days of Exercise per Week: Not on file  . Minutes of Exercise per Session: Not on file   Stress:   . Feeling of Stress : Not on file  Social Connections:   . Frequency of Communication with Friends and Family: Not on file  . Frequency of Social Gatherings with Friends and Family: Not on file  . Attends Religious Services: Not on file  . Active Member of Clubs or Organizations: Not on file  . Attends Banker Meetings: Not on file  . Marital Status: Not on file  Intimate Partner Violence:   . Fear of Current or Ex-Partner: Not on file  . Emotionally Abused: Not on file  . Physically Abused: Not on file  . Sexually Abused: Not on file    Outpatient Medications Prior to Visit  Medication Sig Dispense Refill  . methimazole (TAPAZOLE) 10 MG tablet Take 2 tablets by mouth daily.     . valsartan-hydrochlorothiazide (DIOVAN-HCT) 160-12.5 MG tablet Take 1 tablet by mouth daily. 30 tablet 3  . phentermine (ADIPEX-P) 37.5 MG tablet One tab by mouth qAM (Patient not taking: Reported on 05/21/2020) 30 tablet 0   No facility-administered medications prior to visit.    Allergies  Allergen Reactions  . Ace Inhibitors Cough  . Metformin Diarrhea    Review of Systems See HPI for pertinent positives and negatives    Objective:    Physical Exam Vitals and nursing note reviewed.  Constitutional:      Appearance: She is obese.  HENT:     Head: Normocephalic.  Eyes:     Extraocular Movements: Extraocular movements intact.     Pupils: Pupils are equal, round, and reactive to light.  Cardiovascular:     Rate and Rhythm: Normal rate and regular rhythm.     Heart sounds: Normal heart sounds.  Pulmonary:     Effort: Pulmonary effort is normal.     Breath sounds: Normal breath sounds.  Abdominal:     General: Abdomen is flat. Bowel sounds are increased. There is no abdominal bruit.     Palpations: Abdomen is soft. There is no hepatomegaly or splenomegaly.     Tenderness: There is abdominal tenderness in the right lower quadrant. There is guarding and rebound. There  is no right CVA tenderness or left CVA tenderness. Positive signs include McBurney's sign. Negative signs include Murphy's sign and Rovsing's sign.     Hernia: No hernia is present.  Skin:    General: Skin is warm and dry.     Capillary Refill: Capillary refill takes less than 2 seconds.  Neurological:     General: No focal deficit present.     Mental Status: She is alert and oriented to person, place, and time.  Psychiatric:        Mood and Affect: Mood normal.     BP 134/90   Pulse (!) 111   Temp 97.9 F (36.6 C) (Oral)   Ht 5\' 2"  (1.575  m)   Wt 185 lb 11.2 oz (84.2 kg)   LMP  (Exact Date)   SpO2 97%   BMI 33.96 kg/m  Wt Readings from Last 3 Encounters:  05/21/20 185 lb 11.2 oz (84.2 kg)  12/18/19 188 lb 0.6 oz (85.3 kg)  06/07/19 184 lb (83.5 kg)    There are no preventive care reminders to display for this patient.  There are no preventive care reminders to display for this patient.   Lab Results  Component Value Date   TSH 0.01 (L) 12/18/2019   Lab Results  Component Value Date   WBC 6.7 12/18/2019   HGB 12.8 12/18/2019   HCT 38.7 12/18/2019   MCV 86.0 12/18/2019   PLT 357 12/18/2019   Lab Results  Component Value Date   NA 139 12/18/2019   K 4.4 12/18/2019   CO2 27 12/18/2019   GLUCOSE 101 (H) 12/18/2019   BUN 13 12/18/2019   CREATININE 0.43 (L) 12/18/2019   BILITOT 0.7 12/18/2019   AST 30 12/18/2019   ALT 30 (H) 12/18/2019   PROT 6.6 12/18/2019   CALCIUM 9.5 12/18/2019   Lab Results  Component Value Date   CHOL 97 12/18/2019   Lab Results  Component Value Date   HDL 45 (L) 12/18/2019   Lab Results  Component Value Date   LDLCALC 37 12/18/2019   Lab Results  Component Value Date   TRIG 66 12/18/2019   Lab Results  Component Value Date   CHOLHDL 2.2 12/18/2019   Lab Results  Component Value Date   HGBA1C 5.4 12/18/2019       Assessment & Plan:   Problem List Items Addressed This Visit      Other   Generalized abdominal  pain - Primary    Abdominal pain and cramping in the upper quadrants with radiation to the back around the right side.  Significant tenderness noted to McBurney's point and rebound tenderness present and subjective fevers.  No tenderness noted in the liver or spleen and no ecchymosis noted on the abdomen.  Suspicious of appendicitis vs. Diverticulitis.  Less suspicious of nephrolithiasis.  Alvarado score of 4 today without labs to confirm white blood cell count. We will obtain CBC today and CT of abdomen and pelvis with contrast for further evaluation. CD approval received. We will adjust changes to the plan of care based on lab and CT results. We will plan for follow-up once lab and CT results have been received.      Relevant Orders   CBC with Differential/Platelet   CT Abdomen Pelvis W Contrast     CT Results called from imaging: results show acute appendicitis. Notified patient by telephone of results and instructed patient to go to the emergency room for evaluation. Patient expressed understanding of instructions and reported that she will go to the nearest emergency room for evaluation. Will follow along.   Tollie Eth, NP

## 2020-05-21 NOTE — Assessment & Plan Note (Signed)
Abdominal pain and cramping in the upper quadrants with radiation to the back around the right side.  Significant tenderness noted to McBurney's point and rebound tenderness present and subjective fevers.  No tenderness noted in the liver or spleen and no ecchymosis noted on the abdomen.  Suspicious of appendicitis vs. Diverticulitis.  Less suspicious of nephrolithiasis.  Alvarado score of 4 today without labs to confirm white blood cell count. We will obtain CBC today and CT of abdomen and pelvis with contrast for further evaluation. CD approval received. We will adjust changes to the plan of care based on lab and CT results. We will plan for follow-up once lab and CT results have been received.

## 2020-05-21 NOTE — Progress Notes (Signed)
Patient notified by telephone 05/21/2020 of results showing acute appendicitis. Patient instructed to go to the Emergency room for further evaluation and treatment.  Patient expressed understanding of instructions and reported that she would go to the emergency room immediately. Will follow.

## 2020-05-22 DIAGNOSIS — Z9049 Acquired absence of other specified parts of digestive tract: Secondary | ICD-10-CM | POA: Diagnosis not present

## 2020-05-22 DIAGNOSIS — K353 Acute appendicitis with localized peritonitis, without perforation or gangrene: Secondary | ICD-10-CM | POA: Diagnosis not present

## 2020-05-22 DIAGNOSIS — I10 Essential (primary) hypertension: Secondary | ICD-10-CM | POA: Diagnosis not present

## 2020-05-22 DIAGNOSIS — E669 Obesity, unspecified: Secondary | ICD-10-CM | POA: Diagnosis not present

## 2020-05-22 DIAGNOSIS — G4730 Sleep apnea, unspecified: Secondary | ICD-10-CM | POA: Diagnosis not present

## 2020-05-22 DIAGNOSIS — Z20822 Contact with and (suspected) exposure to covid-19: Secondary | ICD-10-CM | POA: Diagnosis not present

## 2020-05-22 DIAGNOSIS — E0590 Thyrotoxicosis, unspecified without thyrotoxic crisis or storm: Secondary | ICD-10-CM | POA: Diagnosis not present

## 2020-05-22 DIAGNOSIS — Z888 Allergy status to other drugs, medicaments and biological substances status: Secondary | ICD-10-CM | POA: Diagnosis not present

## 2020-05-22 DIAGNOSIS — Z6834 Body mass index (BMI) 34.0-34.9, adult: Secondary | ICD-10-CM | POA: Diagnosis not present

## 2020-05-22 DIAGNOSIS — Z79899 Other long term (current) drug therapy: Secondary | ICD-10-CM | POA: Diagnosis not present

## 2020-10-21 ENCOUNTER — Other Ambulatory Visit: Payer: Self-pay | Admitting: Sports Medicine

## 2020-10-21 DIAGNOSIS — I10 Essential (primary) hypertension: Secondary | ICD-10-CM

## 2021-02-04 ENCOUNTER — Ambulatory Visit: Payer: BC Managed Care – PPO | Admitting: Sports Medicine

## 2021-02-05 DIAGNOSIS — Z888 Allergy status to other drugs, medicaments and biological substances status: Secondary | ICD-10-CM | POA: Diagnosis not present

## 2021-02-05 DIAGNOSIS — I10 Essential (primary) hypertension: Secondary | ICD-10-CM | POA: Diagnosis not present

## 2021-02-05 DIAGNOSIS — M5459 Other low back pain: Secondary | ICD-10-CM | POA: Diagnosis not present

## 2021-02-05 DIAGNOSIS — R1110 Vomiting, unspecified: Secondary | ICD-10-CM | POA: Diagnosis not present

## 2021-02-05 DIAGNOSIS — E0500 Thyrotoxicosis with diffuse goiter without thyrotoxic crisis or storm: Secondary | ICD-10-CM | POA: Diagnosis not present

## 2021-02-05 DIAGNOSIS — E079 Disorder of thyroid, unspecified: Secondary | ICD-10-CM | POA: Diagnosis not present

## 2021-02-05 DIAGNOSIS — Z79899 Other long term (current) drug therapy: Secondary | ICD-10-CM | POA: Diagnosis not present

## 2021-02-05 DIAGNOSIS — R319 Hematuria, unspecified: Secondary | ICD-10-CM | POA: Diagnosis not present

## 2021-02-05 DIAGNOSIS — Z9851 Tubal ligation status: Secondary | ICD-10-CM | POA: Diagnosis not present

## 2021-02-05 DIAGNOSIS — R519 Headache, unspecified: Secondary | ICD-10-CM | POA: Diagnosis not present

## 2021-02-05 DIAGNOSIS — R197 Diarrhea, unspecified: Secondary | ICD-10-CM | POA: Diagnosis not present

## 2021-02-05 DIAGNOSIS — Z20822 Contact with and (suspected) exposure to covid-19: Secondary | ICD-10-CM | POA: Diagnosis not present

## 2021-03-03 ENCOUNTER — Encounter: Payer: Self-pay | Admitting: Medical-Surgical

## 2021-03-03 ENCOUNTER — Ambulatory Visit: Payer: BC Managed Care – PPO | Admitting: Medical-Surgical

## 2021-03-03 ENCOUNTER — Other Ambulatory Visit: Payer: Self-pay

## 2021-03-03 VITALS — BP 170/97 | HR 88 | Resp 20 | Ht 62.0 in | Wt 175.0 lb

## 2021-03-03 DIAGNOSIS — E0500 Thyrotoxicosis with diffuse goiter without thyrotoxic crisis or storm: Secondary | ICD-10-CM

## 2021-03-03 DIAGNOSIS — R531 Weakness: Secondary | ICD-10-CM | POA: Diagnosis not present

## 2021-03-03 DIAGNOSIS — E010 Iodine-deficiency related diffuse (endemic) goiter: Secondary | ICD-10-CM | POA: Diagnosis not present

## 2021-03-03 NOTE — Progress Notes (Signed)
  HPI with pertinent ROS:   CC: Hospital follow-up  HPI: Pleasant 50 year old female presenting today for hospital follow-up.  She was seen in the ED on 7/1 where she was evaluated for Graves' disease, nausea, vomiting, diarrhea, and hematuria.  At the time, she reports she was given 2 L of fluid before being sent home.  Admits that she was not taking her methimazole as prescribed before going to the hospital and had run out because she was due for follow-up.  Since her trip to the ED, she reports she has been taking the methimazole 10 mg twice daily as instructed.  Admits that she does not always take her medications regularly as she sometimes forgets.  Her biggest concerns today revolve around continuing weakness.  She has had quite a few falls recently and has noted that several times she has been unable to get up out of the floor.  She fell at work and was unable to get up by herself.  She also fell with her mom and she was unable to help her mother up or get herself out of the floor.  She has discovered that she is able to walk fine but her tolerance has become very low.  She is careful not to squat down in the floor because she is unable to push herself to standing again.  She would like labs drawn to see if we can evaluate what truly is going on with her.  She is prescribed valsartan with HCTZ but notes she did not have her dose over the last 2 days.  Her blood pressure is elevated in office today at 170/97.  Admits that she is also bad about taking her blood pressure medication and often misses doses.  I reviewed the past medical history, family history, social history, surgical history, and allergies today and no changes were needed.  Please see the problem list section below in epic for further details.   Physical exam:   General: Well Developed, well nourished, and in no acute distress.  Neuro: Alert and oriented x3.  HEENT: Normocephalic, atraumatic.  Skin: Warm and dry. Cardiac:  Regular rate and rhythm, no murmurs rubs or gallops, no lower extremity edema.  Respiratory: Clear to auscultation bilaterally. Not using accessory muscles, speaking in full sentences.  Impression and Recommendations:    1. Weakness 2. Graves disease 3. Thyromegaly Significant thyromegaly noted in office today.  Ordering ultrasound for further evaluation.  Checking labs as below.  Homero Fellers discussion held regarding the importance of medication adherence.  At this point with chronic diseases such as hypertension as well as Graves' disease, medication is no longer optional.  If she wants to continue to be able to function normally and feel decent doing it, she needs to find ways to make sure that she is taking her medications as prescribed.  Patient verbalized understanding of the importance of taking her medication. - CBC with Differential/Platelet - COMPLETE METABOLIC PANEL WITH GFR - Hemoglobin A1c - Thyroid Panel With TSH - Magnesium - Fe+TIBC+Fer - US THYROID; Future  Return if symptoms worsen or fail to improve. ___________________________________________ Thayer Ohm, DNP, APRN, FNP-BC Primary Care and Sports Medicine [facility] [facility]

## 2021-03-04 LAB — CBC WITH DIFFERENTIAL/PLATELET
Absolute Monocytes: 558 cells/uL (ref 200–950)
Basophils Absolute: 48 cells/uL (ref 0–200)
Basophils Relative: 0.8 %
Eosinophils Absolute: 132 cells/uL (ref 15–500)
Eosinophils Relative: 2.2 %
HCT: 41.5 % (ref 35.0–45.0)
Hemoglobin: 13.4 g/dL (ref 11.7–15.5)
Lymphs Abs: 2322 cells/uL (ref 850–3900)
MCH: 27.8 pg (ref 27.0–33.0)
MCHC: 32.3 g/dL (ref 32.0–36.0)
MCV: 86.1 fL (ref 80.0–100.0)
MPV: 10.6 fL (ref 7.5–12.5)
Monocytes Relative: 9.3 %
Neutro Abs: 2940 cells/uL (ref 1500–7800)
Neutrophils Relative %: 49 %
Platelets: 307 10*3/uL (ref 140–400)
RBC: 4.82 10*6/uL (ref 3.80–5.10)
RDW: 12.2 % (ref 11.0–15.0)
Total Lymphocyte: 38.7 %
WBC: 6 10*3/uL (ref 3.8–10.8)

## 2021-03-04 LAB — IRON,TIBC AND FERRITIN PANEL
%SAT: 29 % (calc) (ref 16–45)
Ferritin: 373 ng/mL — ABNORMAL HIGH (ref 16–232)
Iron: 98 ug/dL (ref 40–190)
TIBC: 335 mcg/dL (calc) (ref 250–450)

## 2021-03-04 LAB — COMPLETE METABOLIC PANEL WITH GFR
AG Ratio: 1.4 (calc) (ref 1.0–2.5)
ALT: 21 U/L (ref 6–29)
AST: 28 U/L (ref 10–35)
Albumin: 4.1 g/dL (ref 3.6–5.1)
Alkaline phosphatase (APISO): 91 U/L (ref 31–125)
BUN/Creatinine Ratio: 34 (calc) — ABNORMAL HIGH (ref 6–22)
BUN: 14 mg/dL (ref 7–25)
CO2: 25 mmol/L (ref 20–32)
Calcium: 9.9 mg/dL (ref 8.6–10.2)
Chloride: 103 mmol/L (ref 98–110)
Creat: 0.41 mg/dL — ABNORMAL LOW (ref 0.50–0.99)
Globulin: 3 g/dL (calc) (ref 1.9–3.7)
Glucose, Bld: 91 mg/dL (ref 65–99)
Potassium: 4.1 mmol/L (ref 3.5–5.3)
Sodium: 139 mmol/L (ref 135–146)
Total Bilirubin: 0.8 mg/dL (ref 0.2–1.2)
Total Protein: 7.1 g/dL (ref 6.1–8.1)
eGFR: 121 mL/min/{1.73_m2} (ref >=60)

## 2021-03-04 LAB — HEMOGLOBIN A1C
Hgb A1c MFr Bld: 5.4 % of total Hgb (ref <5.7)
Mean Plasma Glucose: 108 mg/dL
eAG (mmol/L): 6 mmol/L

## 2021-03-04 LAB — THYROID PANEL WITH TSH
Free Thyroxine Index: 10 — ABNORMAL HIGH (ref 1.4–3.8)
T3 Uptake: 36 % — ABNORMAL HIGH (ref 22–35)
T4, Total: 27.9 ug/dL — ABNORMAL HIGH (ref 5.1–11.9)
TSH: <0.01 mIU/L — ABNORMAL LOW

## 2021-03-04 LAB — MAGNESIUM: Magnesium: 1.9 mg/dL (ref 1.5–2.5)

## 2021-03-04 MED ORDER — METHIMAZOLE 10 MG PO TABS: 20.0000 mg | ORAL_TABLET | Freq: Two times a day (BID) | ORAL | 0 refills | Status: DC

## 2021-03-04 NOTE — Addendum Note (Signed)
Addended byChristen Butter on: 03/04/2021 07:40 AM   Modules accepted: Orders

## 2021-05-20 ENCOUNTER — Other Ambulatory Visit: Payer: Self-pay | Admitting: Sports Medicine

## 2021-05-20 ENCOUNTER — Ambulatory Visit (INDEPENDENT_AMBULATORY_CARE_PROVIDER_SITE_OTHER): Payer: BC Managed Care – PPO | Admitting: Sports Medicine

## 2021-05-20 ENCOUNTER — Encounter: Payer: Self-pay | Admitting: Sports Medicine

## 2021-05-20 DIAGNOSIS — I10 Essential (primary) hypertension: Secondary | ICD-10-CM

## 2021-05-20 DIAGNOSIS — E0500 Thyrotoxicosis with diffuse goiter without thyrotoxic crisis or storm: Secondary | ICD-10-CM | POA: Diagnosis not present

## 2021-05-20 MED ORDER — VALSARTAN-HYDROCHLOROTHIAZIDE 160-12.5 MG PO TABS: 1.0000 | ORAL_TABLET | Freq: Every day | ORAL | 3 refills | Status: DC

## 2021-05-20 MED ORDER — METHIMAZOLE 10 MG PO TABS: 10.0000 mg | ORAL_TABLET | Freq: Two times a day (BID) | ORAL | 3 refills | Status: DC

## 2021-05-20 NOTE — Assessment & Plan Note (Addendum)
Has been noncompliant with methimazole, she has restarted it, thyromegaly improving, nontender, rechecking labs. She does need to get an with Dr. Sidney Ace.  Update: Thyroid panel still significantly hyperthyroid, increasing methimazole to 2 tabs twice a day.

## 2021-05-20 NOTE — Progress Notes (Addendum)
    Procedures performed today:    None.  Independent interpretation of notes and tests performed by another provider:   None.  Brief History, Exam, Impression, and Recommendations:    Graves disease Has been noncompliant with methimazole, she has restarted it, thyromegaly improving, nontender, rechecking labs. She does need to get an with Dr. Sidney Ace.  Update: Thyroid panel still significantly hyperthyroid, increasing methimazole to 2 tabs twice a day.  Essential (primary) hypertension Uncontrolled, we will revisit this, continue valsartan/HCTZ.  Chronic processes with exacerbation and pharmacologic intervention  ___________________________________________ Ihor Austin. Benjamin Stain, M.D., ABFM., CAQSM. Primary Care and Sports Medicine [facility]  Adjunct Instructor of Family Medicine  [employer]

## 2021-05-20 NOTE — Assessment & Plan Note (Signed)
Uncontrolled, we will revisit this, continue valsartan/HCTZ.

## 2021-05-21 LAB — THYROID PANEL WITH TSH
Free Thyroxine Index: 7 — ABNORMAL HIGH (ref 1.4–3.8)
T3 Uptake: 35 % (ref 22–35)
T4, Total: 19.9 ug/dL — ABNORMAL HIGH (ref 5.1–11.9)
TSH: <0.01 mIU/L — ABNORMAL LOW

## 2021-05-21 MED ORDER — METHIMAZOLE 10 MG PO TABS: 20.0000 mg | ORAL_TABLET | Freq: Two times a day (BID) | ORAL | 3 refills | Status: DC

## 2021-05-21 NOTE — Addendum Note (Signed)
Addended by: Monica Becton on: 05/21/2021 10:40 AM   Modules accepted: Orders

## 2021-05-25 DIAGNOSIS — E0590 Thyrotoxicosis, unspecified without thyrotoxic crisis or storm: Secondary | ICD-10-CM | POA: Diagnosis not present

## 2021-07-16 ENCOUNTER — Telehealth: Payer: Self-pay | Admitting: Sports Medicine

## 2021-07-16 DIAGNOSIS — G4719 Other hypersomnia: Secondary | ICD-10-CM | POA: Insufficient documentation

## 2021-07-16 NOTE — Assessment & Plan Note (Signed)
Referral to Dr. Jerre Simon for discussion of sleep apnea.

## 2021-07-16 NOTE — Telephone Encounter (Signed)
Excessive daytime sleepiness Referral to Dr. Jerre Simon for discussion of sleep apnea.

## 2021-08-18 DIAGNOSIS — Z6833 Body mass index (BMI) 33.0-33.9, adult: Secondary | ICD-10-CM | POA: Diagnosis not present

## 2021-08-18 DIAGNOSIS — R0683 Snoring: Secondary | ICD-10-CM | POA: Diagnosis not present

## 2021-08-18 DIAGNOSIS — G4710 Hypersomnia, unspecified: Secondary | ICD-10-CM | POA: Diagnosis not present

## 2021-08-18 DIAGNOSIS — E6609 Other obesity due to excess calories: Secondary | ICD-10-CM | POA: Diagnosis not present

## 2021-08-26 ENCOUNTER — Ambulatory Visit: Payer: BC Managed Care – PPO | Admitting: Sports Medicine

## 2022-02-10 ENCOUNTER — Telehealth: Payer: Self-pay | Admitting: Sports Medicine

## 2022-02-10 NOTE — Telephone Encounter (Signed)
Pt called. She cancelled appointments for her mother and herself because son/brother is dying. She wanted Dr T to know this.

## 2022-02-11 ENCOUNTER — Ambulatory Visit: Payer: BC Managed Care – PPO | Admitting: Sports Medicine

## 2022-04-07 ENCOUNTER — Ambulatory Visit: Payer: BC Managed Care – PPO | Admitting: Sports Medicine

## 2022-04-14 ENCOUNTER — Ambulatory Visit: Payer: BC Managed Care – PPO | Admitting: Sports Medicine

## 2022-04-15 ENCOUNTER — Ambulatory Visit: Payer: BC Managed Care – PPO | Admitting: Sports Medicine

## 2022-04-15 ENCOUNTER — Encounter: Payer: Self-pay | Admitting: Sports Medicine

## 2022-04-15 VITALS — BP 194/78 | HR 64 | Ht 62.0 in | Wt 188.0 lb

## 2022-04-15 DIAGNOSIS — I10 Essential (primary) hypertension: Secondary | ICD-10-CM

## 2022-04-15 DIAGNOSIS — Z0000 Encounter for general adult medical examination without abnormal findings: Secondary | ICD-10-CM | POA: Insufficient documentation

## 2022-04-15 DIAGNOSIS — Z1231 Encounter for screening mammogram for malignant neoplasm of breast: Secondary | ICD-10-CM | POA: Diagnosis not present

## 2022-04-15 DIAGNOSIS — Z124 Encounter for screening for malignant neoplasm of cervix: Secondary | ICD-10-CM | POA: Diagnosis not present

## 2022-04-15 DIAGNOSIS — E6609 Other obesity due to excess calories: Secondary | ICD-10-CM

## 2022-04-15 DIAGNOSIS — Z1211 Encounter for screening for malignant neoplasm of colon: Secondary | ICD-10-CM | POA: Diagnosis not present

## 2022-04-15 DIAGNOSIS — G4733 Obstructive sleep apnea (adult) (pediatric): Secondary | ICD-10-CM

## 2022-04-15 MED ORDER — WEGOVY 0.25 MG/0.5ML ~~LOC~~ SOAJ: 0.2500 mg | SUBCUTANEOUS | 0 refills | Status: DC

## 2022-04-15 MED ORDER — VALSARTAN 320 MG PO TABS: 320.0000 mg | ORAL_TABLET | Freq: Every day | ORAL | 11 refills | Status: DC

## 2022-04-15 NOTE — Assessment & Plan Note (Signed)
Ordering a home sleep study.  

## 2022-04-15 NOTE — Progress Notes (Signed)
    Procedures performed today:    None.  Independent interpretation of notes and tests performed by another provider:   None.  Brief History, Exam, Impression, and Recommendations:    Annual physical exam Due for multiple preventative measures including breast, cervical, colon cancer screening. Ordering mammogram, gynecology referral, Cologuard. Routine labs ordered. Declines a flu and Shingrix.  Obstructive apnea Ordering a home sleep study.  Obesity Morbid obesity, she will be part of a multidisciplinary weight loss approach with calorie counting, and exercise prescription, also adding Wegovy. If unable to get [facility] approved we will try compounded semaglutide.  Essential (primary) hypertension Got some cramps on valsartan/HCTZ, switching to valsartan alone but increasing to 320 mg, she will check her blood pressures at home over the next 2 weeks. We can add amlodipine if uncontrolled.  I spent 30 minutes of total time managing this patient today, this includes chart review, face to face, and non-face to face time.  ____________________________________________ Ihor Austin. Benjamin Stain, M.D., ABFM., CAQSM., AME. Primary Care and Sports Medicine [facility]  Adjunct Professor of Family Medicine  [place] [employer] [facility]  Restaurant manager, fast food

## 2022-04-15 NOTE — Assessment & Plan Note (Signed)
Got some cramps on valsartan/HCTZ, switching to valsartan alone but increasing to 320 mg, she will check her blood pressures at home over the next 2 weeks. We can add amlodipine if uncontrolled.

## 2022-04-15 NOTE — Assessment & Plan Note (Signed)
Morbid obesity, she will be part of a multidisciplinary weight loss approach with calorie counting, and exercise prescription, also adding Wegovy. If unable to get [facility] approved we will try compounded semaglutide.

## 2022-04-15 NOTE — Assessment & Plan Note (Signed)
Due for multiple preventative measures including breast, cervical, colon cancer screening. Ordering mammogram, gynecology referral, Cologuard. Routine labs ordered. Declines a flu and Shingrix.

## 2022-04-16 LAB — COMPLETE METABOLIC PANEL WITH GFR
AG Ratio: 1.4 (calc) (ref 1.0–2.5)
ALT: 15 U/L (ref 6–29)
AST: 15 U/L (ref 10–35)
Albumin: 4.1 g/dL (ref 3.6–5.1)
Alkaline phosphatase (APISO): 138 U/L (ref 37–153)
BUN: 14 mg/dL (ref 7–25)
CO2: 27 mmol/L (ref 20–32)
Calcium: 9.5 mg/dL (ref 8.6–10.4)
Chloride: 104 mmol/L (ref 98–110)
Creat: 0.6 mg/dL (ref 0.50–1.03)
Globulin: 2.9 g/dL (calc) (ref 1.9–3.7)
Glucose, Bld: 103 mg/dL — ABNORMAL HIGH (ref 65–99)
Potassium: 4.1 mmol/L (ref 3.5–5.3)
Sodium: 139 mmol/L (ref 135–146)
Total Bilirubin: 1 mg/dL (ref 0.2–1.2)
Total Protein: 7 g/dL (ref 6.1–8.1)
eGFR: 109 mL/min/{1.73_m2} (ref >=60)

## 2022-04-16 LAB — CBC
HCT: 41.3 % (ref 35.0–45.0)
Hemoglobin: 13.8 g/dL (ref 11.7–15.5)
MCH: 28.9 pg (ref 27.0–33.0)
MCHC: 33.4 g/dL (ref 32.0–36.0)
MCV: 86.4 fL (ref 80.0–100.0)
MPV: 11.1 fL (ref 7.5–12.5)
Platelets: 298 10*3/uL (ref 140–400)
RBC: 4.78 10*6/uL (ref 3.80–5.10)
RDW: 12.7 % (ref 11.0–15.0)
WBC: 5.8 10*3/uL (ref 3.8–10.8)

## 2022-04-16 LAB — LIPID PANEL
Cholesterol: 126 mg/dL (ref <200)
HDL: 53 mg/dL (ref >=50)
LDL Cholesterol (Calc): 60 mg/dL (calc)
Non-HDL Cholesterol (Calc): 73 mg/dL (calc) (ref <130)
Total CHOL/HDL Ratio: 2.4 (calc) (ref <5.0)
Triglycerides: 53 mg/dL (ref <150)

## 2022-04-16 LAB — T3, FREE: T3, Free: 6.5 pg/mL — ABNORMAL HIGH (ref 2.3–4.2)

## 2022-04-16 LAB — HEMOGLOBIN A1C
Hgb A1c MFr Bld: 5.4 % of total Hgb (ref <5.7)
Mean Plasma Glucose: 108 mg/dL
eAG (mmol/L): 6 mmol/L

## 2022-04-16 LAB — TSH: TSH: <0.01 mIU/L — ABNORMAL LOW

## 2022-04-16 LAB — T4, FREE: Free T4: 2.1 ng/dL — ABNORMAL HIGH (ref 0.8–1.8)

## 2022-04-28 ENCOUNTER — Ambulatory Visit (INDEPENDENT_AMBULATORY_CARE_PROVIDER_SITE_OTHER): Payer: BC Managed Care – PPO

## 2022-04-28 DIAGNOSIS — Z1231 Encounter for screening mammogram for malignant neoplasm of breast: Secondary | ICD-10-CM

## 2022-04-29 ENCOUNTER — Encounter: Payer: Self-pay | Admitting: Sports Medicine

## 2022-04-29 DIAGNOSIS — E6609 Other obesity due to excess calories: Secondary | ICD-10-CM

## 2022-04-29 MED ORDER — SEMAGLUTIDE (2 MG/DOSE) 8 MG/3ML ~~LOC~~ SOPN: PEN_INJECTOR | SUBCUTANEOUS | 3 refills | Status: DC

## 2022-05-02 ENCOUNTER — Other Ambulatory Visit: Payer: Self-pay | Admitting: Sports Medicine

## 2022-05-02 DIAGNOSIS — R928 Other abnormal and inconclusive findings on diagnostic imaging of breast: Secondary | ICD-10-CM

## 2022-05-04 DIAGNOSIS — Z1211 Encounter for screening for malignant neoplasm of colon: Secondary | ICD-10-CM | POA: Diagnosis not present

## 2022-05-11 LAB — COLOGUARD: COLOGUARD: NEGATIVE

## 2022-05-13 ENCOUNTER — Ambulatory Visit
Admission: RE | Admit: 2022-05-13 | Discharge: 2022-05-13 | Disposition: A | Discharge status: Home / Self Care | Payer: BC Managed Care – PPO | Source: Ambulatory Visit | Attending: Sports Medicine | Admitting: Sports Medicine

## 2022-05-13 DIAGNOSIS — R928 Other abnormal and inconclusive findings on diagnostic imaging of breast: Secondary | ICD-10-CM

## 2022-05-13 DIAGNOSIS — N6002 Solitary cyst of left breast: Secondary | ICD-10-CM | POA: Diagnosis not present

## 2022-06-02 ENCOUNTER — Encounter: Payer: Self-pay | Admitting: Obstetrics and Gynecology

## 2022-06-02 ENCOUNTER — Ambulatory Visit (INDEPENDENT_AMBULATORY_CARE_PROVIDER_SITE_OTHER): Payer: BC Managed Care – PPO | Admitting: Obstetrics and Gynecology

## 2022-06-02 ENCOUNTER — Other Ambulatory Visit (HOSPITAL_COMMUNITY)
Admission: RE | Admit: 2022-06-02 | Discharge: 2022-06-02 | Disposition: A | Discharge status: Home / Self Care | Payer: BC Managed Care – PPO | Source: Ambulatory Visit | Attending: Obstetrics and Gynecology | Admitting: Obstetrics and Gynecology

## 2022-06-02 VITALS — BP 131/84 | HR 73 | Resp 16 | Ht 62.0 in | Wt 188.0 lb

## 2022-06-02 DIAGNOSIS — Z01411 Encounter for gynecological examination (general) (routine) with abnormal findings: Secondary | ICD-10-CM | POA: Diagnosis not present

## 2022-06-02 NOTE — Progress Notes (Signed)
Subjective:     Elizabeth Mcgee is a 51 y.o. female P3 postmenopausal with BMI 34 who is here for a comprehensive physical exam. The patient reports no problems. Patient denies any episodes of vaginal bleeding since entering menopause in her late 65's. She is overdue for a pap smear. She is not sexually active. She denies urinary incontinence or constipation. Patient is without any other complaint  Past Medical History:  Diagnosis Date   Abnormal Pap smear of cervix    Hypertension    Hyperthyroidism    Menorrhagia, premenopausal    Past Surgical History:  Procedure Laterality Date   APPENDECTOMY     CERVICAL BIOPSY  W/ LOOP ELECTRODE EXCISION     DG CHOLECYSTOGRAPHY GALL BLADDER ([facility] HX)     TUBAL LIGATION     Family History  Problem Relation Age of Onset   Kidney disease Father    Hypertension Father    Diabetes Father    Hypercholesterolemia Father    Kidney disease Mother    Hypercholesterolemia Mother    Hypertension Mother    Diabetes Mother    Lymphoma Mother    Hypothyroidism Mother    Hypertension Brother    Diabetes Brother    Kidney disease Brother     Social History   Socioeconomic History   Marital status: Married    Spouse name: Not on file   Number of children: Not on file   Years of education: Not on file   Highest education level: Not on file  Occupational History   Not on file  Tobacco Use   Smoking status: Never   Smokeless tobacco: Never  Substance and Sexual Activity   Alcohol use: Never   Drug use: Never   Sexual activity: Not Currently    Birth control/protection: Surgical  Other Topics Concern   Not on file  Social History Narrative   Not on file   Social Determinants of Health   Financial Resource Strain: Not on file  Food Insecurity: Not on file  Transportation Needs: Not on file  Physical Activity: Not on file  Stress: Not on file  Social Connections: Not on file  Intimate Partner Violence: Not on file   Health Maintenance   Topic Date Due   Zoster Vaccines- Shingrix (1 of 2) Never done   COLONOSCOPY (Pts 45-58yrs Insurance coverage will need to be confirmed)  Never done   PAP SMEAR-Modifier  10/19/2019   INFLUENZA VACCINE  Never done   MAMMOGRAM  04/28/2024   TETANUS/TDAP  05/16/2028   Hepatitis C Screening  Completed   HIV Screening  Completed   HPV VACCINES  Aged Out   COVID-19 Vaccine  Discontinued       Review of Systems Pertinent items noted in HPI and remainder of comprehensive ROS otherwise negative.   Objective:  Blood pressure 131/84, pulse 73, resp. rate 16, height 5\' 2"  (1.575 m), weight 188 lb (85.3 kg), last menstrual period 01/17/2016.   GENERAL: Well-developed, well-nourished female in no acute distress.  HEENT: Normocephalic, atraumatic. Sclerae anicteric.  NECK: Supple. Normal thyroid.  LUNGS: Clear to auscultation bilaterally.  HEART: Regular rate and rhythm. BREASTS: Symmetric in size. No palpable masses or lymphadenopathy, skin changes, or nipple drainage. ABDOMEN: Soft, nontender, nondistended. No organomegaly. PELVIC: Normal external female genitalia. Vagina is pink and rugated.  Normal discharge. Normal appearing cervix. Uterus is normal in size. No adnexal mass or tenderness. Chaperone present during the pelvic exam EXTREMITIES: No cyanosis, clubbing, or edema, 2+ distal  pulses.     Assessment:    Healthy female exam.      Plan:    Pap smear collected Screening mammogram normal on 05/13/22 Patient will be contacted with abnormal results Patient had Colaguard test done a few weeks ago See After Visit Summary for Counseling Recommendations

## 2022-06-02 NOTE — Progress Notes (Signed)
Last mammogram-10/23 Last pap smear-2018

## 2022-06-02 NOTE — Addendum Note (Signed)
Addended by: Lyndal Rainbow on: 06/02/2022 10:42 AM   Modules accepted: Orders

## 2022-06-03 LAB — CYTOLOGY - PAP
Comment: NEGATIVE
Diagnosis: NEGATIVE
High risk HPV: NEGATIVE

## 2022-10-05 ENCOUNTER — Ambulatory Visit: Payer: BC Managed Care – PPO | Admitting: Sports Medicine

## 2022-10-05 ENCOUNTER — Encounter: Payer: Self-pay | Admitting: Sports Medicine

## 2022-10-05 VITALS — BP 137/83 | HR 75 | Wt 176.0 lb

## 2022-10-05 DIAGNOSIS — E6609 Other obesity due to excess calories: Secondary | ICD-10-CM | POA: Diagnosis not present

## 2022-10-05 DIAGNOSIS — F4320 Adjustment disorder, unspecified: Secondary | ICD-10-CM | POA: Insufficient documentation

## 2022-10-05 DIAGNOSIS — F4329 Adjustment disorder with other symptoms: Secondary | ICD-10-CM

## 2022-10-05 MED ORDER — SEMAGLUTIDE (2 MG/DOSE) 8 MG/3ML ~~LOC~~ SOPN: PEN_INJECTOR | SUBCUTANEOUS | 11 refills | Status: DC

## 2022-10-05 MED ORDER — BUPROPION HCL ER (XL) 150 MG PO TB24: 150.0000 mg | ORAL_TABLET | ORAL | 11 refills | Status: AC

## 2022-10-05 NOTE — Assessment & Plan Note (Signed)
Morbid obesity, refilling semaglutide, she did lose about 12 pounds.

## 2022-10-05 NOTE — Progress Notes (Signed)
    Procedures performed today:    None.  Independent interpretation of notes and tests performed by another provider:   None.  Brief History, Exam, Impression, and Recommendations:    Obesity Morbid obesity, refilling semaglutide, she did lose about 12 pounds.  Adjustment disorder Multiple life stressors, mother is sick, recently had a femoral fracture, has to many dogs in the house. Adding Wellbutrin, we will discuss this in the future.    ____________________________________________ Gwen Her. Dianah Field, M.D., ABFM., CAQSM., AME. Primary Care and Sports Medicine [facility]  Adjunct Professor of [place] [employer]  Risk manager

## 2022-10-05 NOTE — Assessment & Plan Note (Signed)
Multiple life stressors, mother is sick, recently had a femoral fracture, has to many dogs in the house. Adding Wellbutrin, we will discuss this in the future.

## 2022-10-06 ENCOUNTER — Encounter: Payer: Self-pay | Admitting: Sports Medicine

## 2022-10-06 DIAGNOSIS — E6609 Other obesity due to excess calories: Secondary | ICD-10-CM

## 2022-10-06 MED ORDER — SEMAGLUTIDE (2 MG/DOSE) 8 MG/3ML ~~LOC~~ SOPN: PEN_INJECTOR | SUBCUTANEOUS | 11 refills | Status: AC

## 2022-12-08 ENCOUNTER — Encounter: Payer: Self-pay | Admitting: Sports Medicine

## 2022-12-08 DIAGNOSIS — E0500 Thyrotoxicosis with diffuse goiter without thyrotoxic crisis or storm: Secondary | ICD-10-CM

## 2022-12-08 MED ORDER — METHIMAZOLE 10 MG PO TABS: 20.0000 mg | ORAL_TABLET | Freq: Two times a day (BID) | ORAL | 3 refills | Status: DC

## 2023-01-31 ENCOUNTER — Encounter: Payer: Self-pay | Admitting: Sports Medicine

## 2023-05-13 ENCOUNTER — Other Ambulatory Visit: Payer: Self-pay | Admitting: Sports Medicine

## 2023-05-13 DIAGNOSIS — I10 Essential (primary) hypertension: Secondary | ICD-10-CM

## 2023-11-22 ENCOUNTER — Other Ambulatory Visit: Payer: Self-pay

## 2023-11-22 DIAGNOSIS — I10 Essential (primary) hypertension: Secondary | ICD-10-CM

## 2023-11-22 MED ORDER — VALSARTAN 320 MG PO TABS: 320.0000 mg | ORAL_TABLET | Freq: Every day | ORAL | 0 refills | Status: DC

## 2024-01-23 ENCOUNTER — Other Ambulatory Visit: Payer: Self-pay | Admitting: Sports Medicine

## 2024-01-23 DIAGNOSIS — E0500 Thyrotoxicosis with diffuse goiter without thyrotoxic crisis or storm: Secondary | ICD-10-CM

## 2024-02-18 ENCOUNTER — Other Ambulatory Visit: Payer: Self-pay | Admitting: Sports Medicine

## 2024-02-18 DIAGNOSIS — I10 Essential (primary) hypertension: Secondary | ICD-10-CM

## 2024-03-01 ENCOUNTER — Ambulatory Visit

## 2024-03-01 ENCOUNTER — Ambulatory Visit: Admitting: Sports Medicine

## 2024-03-01 VITALS — BP 136/87 | HR 61 | Ht 62.0 in | Wt 205.0 lb

## 2024-03-01 DIAGNOSIS — E0500 Thyrotoxicosis with diffuse goiter without thyrotoxic crisis or storm: Secondary | ICD-10-CM | POA: Diagnosis not present

## 2024-03-01 DIAGNOSIS — M25552 Pain in left hip: Secondary | ICD-10-CM

## 2024-03-01 DIAGNOSIS — M25522 Pain in left elbow: Secondary | ICD-10-CM | POA: Diagnosis not present

## 2024-03-01 DIAGNOSIS — I10 Essential (primary) hypertension: Secondary | ICD-10-CM

## 2024-03-01 DIAGNOSIS — Z87898 Personal history of other specified conditions: Secondary | ICD-10-CM

## 2024-03-01 DIAGNOSIS — Z0000 Encounter for general adult medical examination without abnormal findings: Secondary | ICD-10-CM | POA: Diagnosis not present

## 2024-03-01 MED ORDER — NAPROXEN 500 MG PO TABS: 500.0000 mg | ORAL_TABLET | Freq: Two times a day (BID) | ORAL | 3 refills | Status: AC

## 2024-03-01 MED ORDER — METHIMAZOLE 10 MG PO TABS: 20.0000 mg | ORAL_TABLET | Freq: Two times a day (BID) | ORAL | 3 refills | Status: DC

## 2024-03-01 MED ORDER — VALSARTAN 320 MG PO TABS: 320.0000 mg | ORAL_TABLET | Freq: Every day | ORAL | 3 refills | Status: AC

## 2024-03-01 NOTE — Assessment & Plan Note (Signed)
 Declines flu and Shingrix. Up-to-date on other screenings. Routine labs today.

## 2024-03-01 NOTE — Assessment & Plan Note (Signed)
 Pain left anterior hip, tends to lean to the painful side. Exam is for the most part benign with exception of profoundly weak hip abductors. Abdominal exam is normal. Naproxen 500 twice daily. Adding x-rays, hip abductor conditioning, return to see me 6 weeks.

## 2024-03-01 NOTE — Progress Notes (Addendum)
    Procedures performed today:    None.  Independent interpretation of notes and tests performed by another provider:   None.  Brief History, Exam, Impression, and Recommendations:    Annual physical exam Declines flu and Shingrix. Up-to-date on other screenings. Routine labs today.   Left hip pain Pain left anterior hip, tends to lean to the painful side. Exam is for the most part benign with exception of profoundly weak hip abductors. Abdominal exam is normal. Naproxen  500 twice daily. Adding x-rays, hip abductor conditioning, return to see me 6 weeks.  Left elbow pain Has been picking up her mother frequently, now has pain anterior elbow, positive Yergason test, no concordant tenderness over the lateral epicondyle. Suspect distal biceps tendinitis. She will do an elbow sleeve, naproxen  as above, home conditioning, return in 6 weeks.  Graves disease TSH is high and T4 is low suggesting overtreatment with methimazole , decrease methimazole  to 1 tablet twice daily and we can recheck TSH, T4 in 6 to 8 weeks.    ____________________________________________ Debby PARAS. Curtis, M.D., ABFM., CAQSM., AME. Primary Care and Sports Medicine [facility]  Adjunct Professor of [facility] Medicine  [employer]  Restaurant manager, [employer]

## 2024-03-01 NOTE — Assessment & Plan Note (Signed)
 Has been picking up her mother frequently, now has pain anterior elbow, positive Yergason test, no concordant tenderness over the lateral epicondyle. Suspect distal biceps tendinitis. She will do an elbow sleeve, naproxen as above, home conditioning, return in 6 weeks.

## 2024-03-02 LAB — COMPREHENSIVE METABOLIC PANEL WITH GFR
ALT: 14 IU/L (ref 0–32)
AST: 17 IU/L (ref 0–40)
Albumin: 4.3 g/dL (ref 3.8–4.9)
Alkaline Phosphatase: 83 IU/L (ref 44–121)
BUN/Creatinine Ratio: 24 — ABNORMAL HIGH (ref 9–23)
BUN: 15 mg/dL (ref 6–24)
Bilirubin Total: 0.7 mg/dL (ref 0.0–1.2)
CO2: 16 mmol/L — ABNORMAL LOW (ref 20–29)
Calcium: 8.8 mg/dL (ref 8.7–10.2)
Chloride: 103 mmol/L (ref 96–106)
Creatinine, Ser: 0.62 mg/dL (ref 0.57–1.00)
Globulin, Total: 2.3 g/dL (ref 1.5–4.5)
Glucose: 95 mg/dL (ref 70–99)
Potassium: 4 mmol/L (ref 3.5–5.2)
Sodium: 140 mmol/L (ref 134–144)
Total Protein: 6.6 g/dL (ref 6.0–8.5)
eGFR: 107 mL/min/1.73 (ref >59)

## 2024-03-02 LAB — T3, FREE: T3, Free: 3.2 pg/mL (ref 2.0–4.4)

## 2024-03-02 LAB — CBC
Hematocrit: 38.4 % (ref 34.0–46.6)
Hemoglobin: 12.7 g/dL (ref 11.1–15.9)
MCH: 30.4 pg (ref 26.6–33.0)
MCHC: 33.1 g/dL (ref 31.5–35.7)
MCV: 92 fL (ref 79–97)
Platelets: 313 x10E3/uL (ref 150–450)
RBC: 4.18 x10E6/uL (ref 3.77–5.28)
RDW: 13 % (ref 11.7–15.4)
WBC: 6.5 x10E3/uL (ref 3.4–10.8)

## 2024-03-02 LAB — LIPID PANEL
Chol/HDL Ratio: 2.8 ratio (ref 0.0–4.4)
Cholesterol, Total: 141 mg/dL (ref 100–199)
HDL: 50 mg/dL (ref >39)
LDL Chol Calc (NIH): 76 mg/dL (ref 0–99)
Triglycerides: 76 mg/dL (ref 0–149)
VLDL Cholesterol Cal: 15 mg/dL (ref 5–40)

## 2024-03-02 LAB — HEMOGLOBIN A1C
Est. average glucose Bld gHb Est-mCnc: 126 mg/dL
Hgb A1c MFr Bld: 6 % — ABNORMAL HIGH (ref 4.8–5.6)

## 2024-03-02 LAB — TSH: TSH: 11.8 u[IU]/mL — ABNORMAL HIGH (ref 0.450–4.500)

## 2024-03-02 LAB — T4, FREE: Free T4: 0.54 ng/dL — ABNORMAL LOW (ref 0.82–1.77)

## 2024-03-04 ENCOUNTER — Ambulatory Visit: Payer: Self-pay | Admitting: Sports Medicine

## 2024-03-04 DIAGNOSIS — E0500 Thyrotoxicosis with diffuse goiter without thyrotoxic crisis or storm: Secondary | ICD-10-CM

## 2024-03-04 NOTE — Assessment & Plan Note (Signed)
 TSH is high and T4 is low suggesting overtreatment with methimazole , decrease methimazole  to 1 tablet twice daily and we can recheck TSH, T4 in 6 to 8 weeks.

## 2024-03-04 NOTE — Addendum Note (Signed)
 Addended by: CURTIS DEBBY PARAS on: 03/04/2024 04:39 PM   Modules accepted: Orders

## 2024-03-05 MED ORDER — METHIMAZOLE 5 MG PO TABS: 5.0000 mg | ORAL_TABLET | Freq: Two times a day (BID) | ORAL | 6 refills | Status: AC

## 2024-03-05 NOTE — Assessment & Plan Note (Signed)
 TSH high, T4 low suggesting overtreatment with methimazole , on further questioning patient was only taking 1 tablet twice daily, we will decrease methimazole  to 5 mg twice daily and recheck T4 TSH in 6 to 8 weeks.

## 2024-04-09 ENCOUNTER — Encounter: Payer: Self-pay | Admitting: Sports Medicine

## 2024-04-12 ENCOUNTER — Ambulatory Visit: Admitting: Sports Medicine

## 2024-05-17 DIAGNOSIS — E0500 Thyrotoxicosis with diffuse goiter without thyrotoxic crisis or storm: Secondary | ICD-10-CM | POA: Diagnosis not present

## 2024-05-17 DIAGNOSIS — R7303 Prediabetes: Secondary | ICD-10-CM | POA: Diagnosis not present

## 2024-05-17 DIAGNOSIS — I10 Essential (primary) hypertension: Secondary | ICD-10-CM | POA: Diagnosis not present

## 2024-05-17 DIAGNOSIS — E669 Obesity, unspecified: Secondary | ICD-10-CM | POA: Diagnosis not present

## 2024-05-17 DIAGNOSIS — Z1330 Encounter for screening examination for mental health and behavioral disorders, unspecified: Secondary | ICD-10-CM | POA: Diagnosis not present
